# Patient Record
Sex: Female | Born: 1975
Health system: Southern US, Community
[De-identification: ages and names within clinical notes are randomized; demographics above are authoritative.]

## PROBLEM LIST (undated history)

## (undated) DIAGNOSIS — I10 Essential (primary) hypertension: Secondary | ICD-10-CM

## (undated) DIAGNOSIS — D649 Anemia, unspecified: Secondary | ICD-10-CM

## (undated) DIAGNOSIS — T8859XA Other complications of anesthesia, initial encounter: Secondary | ICD-10-CM

## (undated) DIAGNOSIS — T7840XA Allergy, unspecified, initial encounter: Secondary | ICD-10-CM

## (undated) HISTORY — DX: Allergy, unspecified, initial encounter: T78.40XA

## (undated) HISTORY — DX: Anemia, unspecified: D64.9

---

## 1998-08-19 ENCOUNTER — Other Ambulatory Visit: Admission: RE | Admit: 1998-08-19 | Discharge: 1998-08-19 | Payer: Self-pay | Admitting: Obstetrics and Gynecology

## 1998-09-24 ENCOUNTER — Encounter: Payer: Self-pay | Admitting: Obstetrics and Gynecology

## 1998-09-24 ENCOUNTER — Ambulatory Visit (HOSPITAL_COMMUNITY): Admission: RE | Admit: 1998-09-24 | Discharge: 1998-09-24 | Payer: Self-pay | Admitting: Obstetrics and Gynecology

## 1998-09-30 ENCOUNTER — Encounter: Payer: Self-pay | Admitting: Emergency Medicine

## 1998-09-30 ENCOUNTER — Emergency Department (HOSPITAL_COMMUNITY): Admission: EM | Admit: 1998-09-30 | Discharge: 1998-09-30 | Payer: Self-pay | Admitting: Emergency Medicine

## 1998-10-06 ENCOUNTER — Inpatient Hospital Stay (HOSPITAL_COMMUNITY): Admission: AD | Admit: 1998-10-06 | Discharge: 1998-10-06 | Payer: Self-pay | Admitting: Obstetrics and Gynecology

## 1998-10-23 ENCOUNTER — Ambulatory Visit (HOSPITAL_COMMUNITY): Admission: RE | Admit: 1998-10-23 | Discharge: 1998-10-23 | Payer: Self-pay | Admitting: Obstetrics & Gynecology

## 1998-11-27 ENCOUNTER — Encounter: Payer: Self-pay | Admitting: Obstetrics and Gynecology

## 1998-11-27 ENCOUNTER — Ambulatory Visit (HOSPITAL_COMMUNITY): Admission: RE | Admit: 1998-11-27 | Discharge: 1998-11-27 | Payer: Self-pay | Admitting: Obstetrics and Gynecology

## 1998-12-03 ENCOUNTER — Inpatient Hospital Stay (HOSPITAL_COMMUNITY): Admission: AD | Admit: 1998-12-03 | Discharge: 1998-12-03 | Payer: Self-pay | Admitting: Obstetrics and Gynecology

## 1998-12-03 ENCOUNTER — Encounter (HOSPITAL_COMMUNITY): Admission: RE | Admit: 1998-12-03 | Discharge: 1999-02-22 | Payer: Self-pay | Admitting: Obstetrics and Gynecology

## 1998-12-04 ENCOUNTER — Observation Stay (HOSPITAL_COMMUNITY): Admission: AD | Admit: 1998-12-04 | Discharge: 1998-12-04 | Payer: Self-pay | Admitting: Obstetrics and Gynecology

## 1998-12-10 ENCOUNTER — Inpatient Hospital Stay (HOSPITAL_COMMUNITY): Admission: AD | Admit: 1998-12-10 | Discharge: 1998-12-10 | Payer: Self-pay | Admitting: Obstetrics & Gynecology

## 1998-12-14 ENCOUNTER — Encounter: Payer: Self-pay | Admitting: Obstetrics and Gynecology

## 1998-12-22 ENCOUNTER — Inpatient Hospital Stay (HOSPITAL_COMMUNITY): Admission: AD | Admit: 1998-12-22 | Discharge: 1998-12-22 | Payer: Self-pay | Admitting: Obstetrics and Gynecology

## 1998-12-29 ENCOUNTER — Observation Stay (HOSPITAL_COMMUNITY): Admission: AD | Admit: 1998-12-29 | Discharge: 1998-12-30 | Payer: Self-pay | Admitting: Obstetrics and Gynecology

## 1998-12-30 ENCOUNTER — Encounter: Payer: Self-pay | Admitting: Obstetrics and Gynecology

## 1999-01-26 ENCOUNTER — Inpatient Hospital Stay (HOSPITAL_COMMUNITY): Admission: AD | Admit: 1999-01-26 | Discharge: 1999-01-26 | Payer: Self-pay | Admitting: Obstetrics and Gynecology

## 1999-01-31 ENCOUNTER — Inpatient Hospital Stay (HOSPITAL_COMMUNITY): Admission: AD | Admit: 1999-01-31 | Discharge: 1999-01-31 | Payer: Self-pay | Admitting: Obstetrics and Gynecology

## 1999-02-07 ENCOUNTER — Inpatient Hospital Stay (HOSPITAL_COMMUNITY): Admission: AD | Admit: 1999-02-07 | Discharge: 1999-02-07 | Payer: Self-pay | Admitting: Obstetrics & Gynecology

## 1999-02-08 ENCOUNTER — Observation Stay (HOSPITAL_COMMUNITY): Admission: AD | Admit: 1999-02-08 | Discharge: 1999-02-09 | Payer: Self-pay | Admitting: Obstetrics & Gynecology

## 1999-02-14 ENCOUNTER — Inpatient Hospital Stay (HOSPITAL_COMMUNITY): Admission: AD | Admit: 1999-02-14 | Discharge: 1999-02-14 | Payer: Self-pay | Admitting: *Deleted

## 1999-02-15 ENCOUNTER — Inpatient Hospital Stay (HOSPITAL_COMMUNITY): Admission: AD | Admit: 1999-02-15 | Discharge: 1999-02-15 | Payer: Self-pay | Admitting: Obstetrics and Gynecology

## 1999-02-19 ENCOUNTER — Inpatient Hospital Stay (HOSPITAL_COMMUNITY): Admission: AD | Admit: 1999-02-19 | Discharge: 1999-02-23 | Payer: Self-pay | Admitting: Obstetrics and Gynecology

## 2000-01-18 ENCOUNTER — Emergency Department (HOSPITAL_COMMUNITY): Admission: EM | Admit: 2000-01-18 | Discharge: 2000-01-18 | Payer: Self-pay | Admitting: *Deleted

## 2000-01-31 ENCOUNTER — Emergency Department (HOSPITAL_COMMUNITY): Admission: EM | Admit: 2000-01-31 | Discharge: 2000-01-31 | Payer: Self-pay | Admitting: *Deleted

## 2000-02-02 ENCOUNTER — Emergency Department (HOSPITAL_COMMUNITY): Admission: EM | Admit: 2000-02-02 | Discharge: 2000-02-02 | Payer: Self-pay | Admitting: *Deleted

## 2000-02-06 ENCOUNTER — Inpatient Hospital Stay (HOSPITAL_COMMUNITY): Admission: AD | Admit: 2000-02-06 | Discharge: 2000-02-06 | Payer: Self-pay | Admitting: Obstetrics & Gynecology

## 2000-08-16 ENCOUNTER — Encounter: Payer: Self-pay | Admitting: Emergency Medicine

## 2000-08-16 ENCOUNTER — Emergency Department (HOSPITAL_COMMUNITY): Admission: EM | Admit: 2000-08-16 | Discharge: 2000-08-16 | Payer: Self-pay | Admitting: Emergency Medicine

## 2000-08-28 ENCOUNTER — Inpatient Hospital Stay (HOSPITAL_COMMUNITY): Admission: AD | Admit: 2000-08-28 | Discharge: 2000-08-28 | Payer: Self-pay | Admitting: Obstetrics

## 2000-08-28 ENCOUNTER — Encounter: Payer: Self-pay | Admitting: Obstetrics

## 2000-09-25 ENCOUNTER — Emergency Department (HOSPITAL_COMMUNITY): Admission: EM | Admit: 2000-09-25 | Discharge: 2000-09-25 | Payer: Self-pay | Admitting: Emergency Medicine

## 2000-10-09 ENCOUNTER — Encounter: Payer: Self-pay | Admitting: *Deleted

## 2000-10-09 ENCOUNTER — Emergency Department (HOSPITAL_COMMUNITY): Admission: EM | Admit: 2000-10-09 | Discharge: 2000-10-09 | Payer: Self-pay | Admitting: *Deleted

## 2000-12-26 ENCOUNTER — Emergency Department (HOSPITAL_COMMUNITY): Admission: EM | Admit: 2000-12-26 | Discharge: 2000-12-26 | Payer: Self-pay | Admitting: Emergency Medicine

## 2001-06-02 ENCOUNTER — Encounter: Payer: Self-pay | Admitting: Emergency Medicine

## 2001-06-02 ENCOUNTER — Emergency Department (HOSPITAL_COMMUNITY): Admission: EM | Admit: 2001-06-02 | Discharge: 2001-06-02 | Payer: Self-pay | Admitting: Emergency Medicine

## 2002-01-30 ENCOUNTER — Emergency Department (HOSPITAL_COMMUNITY): Admission: EM | Admit: 2002-01-30 | Discharge: 2002-01-30 | Payer: Self-pay | Admitting: Emergency Medicine

## 2002-11-05 ENCOUNTER — Encounter: Payer: Self-pay | Admitting: *Deleted

## 2002-11-05 ENCOUNTER — Inpatient Hospital Stay (HOSPITAL_COMMUNITY): Admission: AD | Admit: 2002-11-05 | Discharge: 2002-11-05 | Payer: Self-pay | Admitting: *Deleted

## 2003-03-14 ENCOUNTER — Inpatient Hospital Stay (HOSPITAL_COMMUNITY): Admission: AD | Admit: 2003-03-14 | Discharge: 2003-03-14 | Payer: Self-pay | Admitting: Family Medicine

## 2003-04-14 ENCOUNTER — Inpatient Hospital Stay (HOSPITAL_COMMUNITY): Admission: AD | Admit: 2003-04-14 | Discharge: 2003-04-14 | Payer: Self-pay | Admitting: Obstetrics and Gynecology

## 2005-10-17 ENCOUNTER — Emergency Department (HOSPITAL_COMMUNITY): Admission: EM | Admit: 2005-10-17 | Discharge: 2005-10-17 | Payer: Self-pay | Admitting: Emergency Medicine

## 2005-12-14 ENCOUNTER — Inpatient Hospital Stay (HOSPITAL_COMMUNITY): Admission: AD | Admit: 2005-12-14 | Discharge: 2005-12-15 | Payer: Self-pay | Admitting: Gynecology

## 2006-06-22 ENCOUNTER — Emergency Department (HOSPITAL_COMMUNITY): Admission: EM | Admit: 2006-06-22 | Discharge: 2006-06-22 | Payer: Self-pay | Admitting: Emergency Medicine

## 2006-10-10 ENCOUNTER — Emergency Department (HOSPITAL_COMMUNITY): Admission: EM | Admit: 2006-10-10 | Discharge: 2006-10-10 | Payer: Self-pay | Admitting: Family Medicine

## 2007-12-26 ENCOUNTER — Ambulatory Visit: Payer: Self-pay | Admitting: Pulmonary Disease

## 2007-12-26 DIAGNOSIS — J309 Allergic rhinitis, unspecified: Secondary | ICD-10-CM | POA: Insufficient documentation

## 2007-12-26 DIAGNOSIS — J45909 Unspecified asthma, uncomplicated: Secondary | ICD-10-CM

## 2008-03-15 ENCOUNTER — Inpatient Hospital Stay (HOSPITAL_COMMUNITY): Admission: AD | Admit: 2008-03-15 | Discharge: 2008-03-15 | Payer: Self-pay | Admitting: Obstetrics & Gynecology

## 2008-04-17 ENCOUNTER — Emergency Department (HOSPITAL_COMMUNITY): Admission: EM | Admit: 2008-04-17 | Discharge: 2008-04-17 | Payer: Self-pay | Admitting: Emergency Medicine

## 2009-01-06 ENCOUNTER — Emergency Department (HOSPITAL_COMMUNITY): Admission: EM | Admit: 2009-01-06 | Discharge: 2009-01-06 | Payer: Self-pay | Admitting: Emergency Medicine

## 2009-07-14 ENCOUNTER — Emergency Department (HOSPITAL_COMMUNITY): Admission: EM | Admit: 2009-07-14 | Discharge: 2009-07-14 | Payer: Self-pay | Admitting: Family Medicine

## 2010-01-14 ENCOUNTER — Emergency Department (HOSPITAL_COMMUNITY): Admission: EM | Admit: 2010-01-14 | Discharge: 2010-01-14 | Payer: Self-pay | Admitting: Emergency Medicine

## 2010-03-10 ENCOUNTER — Emergency Department (HOSPITAL_COMMUNITY): Admission: EM | Admit: 2010-03-10 | Discharge: 2010-03-10 | Payer: Self-pay | Admitting: Family Medicine

## 2010-09-01 ENCOUNTER — Emergency Department (HOSPITAL_COMMUNITY)
Admission: EM | Admit: 2010-09-01 | Discharge: 2010-09-01 | Payer: Self-pay | Source: Home / Self Care | Admitting: Family Medicine

## 2010-11-17 ENCOUNTER — Inpatient Hospital Stay (INDEPENDENT_AMBULATORY_CARE_PROVIDER_SITE_OTHER)
Admission: RE | Admit: 2010-11-17 | Discharge: 2010-11-17 | Disposition: A | Discharge status: Home / Self Care | Payer: Medicaid Other | Source: Ambulatory Visit | Attending: Emergency Medicine | Admitting: Emergency Medicine

## 2010-11-17 DIAGNOSIS — J019 Acute sinusitis, unspecified: Secondary | ICD-10-CM

## 2010-12-21 ENCOUNTER — Inpatient Hospital Stay (INDEPENDENT_AMBULATORY_CARE_PROVIDER_SITE_OTHER)
Admission: RE | Admit: 2010-12-21 | Discharge: 2010-12-21 | Disposition: A | Discharge status: Home / Self Care | Payer: Medicaid Other | Source: Ambulatory Visit | Attending: Family Medicine | Admitting: Family Medicine

## 2010-12-21 DIAGNOSIS — R6889 Other general symptoms and signs: Secondary | ICD-10-CM

## 2010-12-21 LAB — POCT URINALYSIS DIP (DEVICE)
Nitrite: NEGATIVE
Specific Gravity, Urine: 1.015 (ref 1.005–1.030)
Urobilinogen, UA: 1 mg/dL (ref 0.0–1.0)

## 2010-12-21 LAB — POCT I-STAT, CHEM 8
BUN: 16 mg/dL (ref 6–23)
Chloride: 102 mEq/L (ref 96–112)
Potassium: 4.1 mEq/L (ref 3.5–5.1)
Sodium: 139 mEq/L (ref 135–145)
TCO2: 27 mmol/L (ref 0–100)

## 2010-12-21 LAB — GLUCOSE, CAPILLARY: Glucose-Capillary: 109 mg/dL — ABNORMAL HIGH (ref 70–99)

## 2011-06-30 LAB — URINALYSIS, ROUTINE W REFLEX MICROSCOPIC
Glucose, UA: NEGATIVE
Ketones, ur: NEGATIVE
Specific Gravity, Urine: 1.015
pH: 7

## 2011-06-30 LAB — URINE CULTURE: Colony Count: >=100000

## 2011-06-30 LAB — POCT PREGNANCY, URINE: Preg Test, Ur: POSITIVE

## 2011-12-13 ENCOUNTER — Emergency Department (HOSPITAL_COMMUNITY)
Admission: EM | Admit: 2011-12-13 | Discharge: 2011-12-13 | Disposition: A | Discharge status: Home / Self Care | Payer: Medicaid Other | Source: Home / Self Care | Attending: Emergency Medicine | Admitting: Emergency Medicine

## 2011-12-13 ENCOUNTER — Encounter (HOSPITAL_COMMUNITY): Payer: Self-pay | Admitting: Emergency Medicine

## 2011-12-13 DIAGNOSIS — R609 Edema, unspecified: Secondary | ICD-10-CM

## 2011-12-13 HISTORY — DX: Essential (primary) hypertension: I10

## 2011-12-13 MED ORDER — FUROSEMIDE 20 MG PO TABS: 20.0000 mg | ORAL_TABLET | Freq: Two times a day (BID) | ORAL | Status: AC

## 2011-12-13 MED ORDER — POTASSIUM CHLORIDE ER 10 MEQ PO TBCR: 20.0000 meq | EXTENDED_RELEASE_TABLET | Freq: Once | ORAL | Status: AC

## 2011-12-13 NOTE — ED Notes (Addendum)
Patient goes to [facility]

## 2011-12-13 NOTE — ED Notes (Addendum)
Ankle swelling for two months.  Patient reports no change in swelling with elevation or after sleeping all night.  Pain intermittent lower legs, ankle, feet -irritating and "like skin can't stretch anymore".  Denies chest pain.  Denies sob.  Patient did see a physician recently, she prescribed blood pressure medicine. Has been on bp medicine for a month.  Reports no change in swelling with medicine.  Patient is concerned for diabetes

## 2011-12-13 NOTE — Discharge Instructions (Signed)
Discussed followup with your primary care Dr. for a more specific plan of weight reduction and lifestyle modifications as I suspect your peripheral edema is related to overweight and current lifestyle. Take his medicines as prescribed for the next 5 days.     Peripheral Edema You have swelling in your legs (peripheral edema). This swelling is due to excess accumulation of salt and water in your body. Edema may be a sign of heart, kidney or liver disease, or a side effect of a medication. It may also be due to problems in the leg veins. Elevating your legs and using special support stockings may be very helpful, if the cause of the swelling is due to poor venous circulation. Avoid long periods of standing, whatever the cause. Treatment of edema depends on identifying the cause. Chips, pretzels, pickles and other salty foods should be avoided. Restricting salt in your diet is almost always needed. Water pills (diuretics) are often used to remove the excess salt and water from your body via urine. These medicines prevent the kidney from reabsorbing sodium. This increases urine flow. Diuretic treatment may also result in lowering of potassium levels in your body. Potassium supplements may be needed if you have to use diuretics daily. Daily weights can help you keep track of your progress in clearing your edema. You should call your caregiver for follow up care as recommended. SEEK IMMEDIATE MEDICAL CARE IF:   You have increased swelling, pain, redness, or heat in your legs.   You develop shortness of breath, especially when lying down.   You develop chest or abdominal pain, weakness, or fainting.   You have a fever.  Document Released: 10/27/2004 Document Revised: 09/08/2011 Document Reviewed: 10/07/2009 [facility] Patient Information 2012 [place], [place].

## 2011-12-13 NOTE — ED Provider Notes (Signed)
History     CSN: [id number]  Arrival date & time 12/13/11  1112   First MD Initiated Contact with Patient 12/13/11 1221      Chief Complaint  Patient presents with  . Joint Swelling    (Consider location/radiation/quality/duration/timing/severity/associated sxs/prior treatment) HPI Comments: The swelling of my legs, its not getting any better been taking this small pill for my blood pressure nd decrease my swelling but is not getting any better"  NO SOB, NO INJURIES OR TRAUMA,  FEEL  The swelling its worse at the end of the day,I also want to be checked to see if im diabetic as I have a lot of relatives with diabetes an i drink  Lot of fluids  The history is provided by the patient.    Past Medical History  Diagnosis Date  . Hypertension   . Asthma     Past Surgical History  Procedure Date  . Cesarean section     Family History  Problem Relation Age of Onset  . Diabetes Mother   . Multiple sclerosis Mother   . Hypertension Father   . Diabetes Father     History  Substance Use Topics  . Smoking status: Never Smoker   . Smokeless tobacco: Not on file  . Alcohol Use: Yes    OB History    Grav Para Term Preterm Abortions TAB SAB Ect Mult Living                  Review of Systems  Constitutional: Negative for fever and activity change.  Respiratory: Negative for chest tightness and shortness of breath.   Cardiovascular: Positive for leg swelling. Negative for chest pain and palpitations.  Gastrointestinal: Negative for abdominal pain.  Genitourinary: Positive for frequency. Negative for dysuria and urgency.  Musculoskeletal: Negative for myalgias and joint swelling.  Skin: Negative for rash.    Allergies  Oxycodone  Home Medications   Current Outpatient Rx  Name Route Sig Dispense Refill  . FUROSEMIDE 20 MG PO TABS Oral Take 1 tablet (20 mg total) by mouth 2 (two) times daily. 5 tablet 0  . POTASSIUM CHLORIDE ER 10 MEQ PO TBCR Oral Take 2 tablets  (20 mEq total) by mouth once. 5 tablet 0    BP 117/73  Pulse 78  Temp(Src) 97.3 F (36.3 C) (Oral)  Resp 16  SpO2 99%  Physical Exam  Nursing note and vitals reviewed. Constitutional: She appears well-nourished. No distress.  HENT:  Head: Normocephalic.  Eyes: Conjunctivae are normal. No scleral icterus.  Neck: No JVD present.  Cardiovascular: Normal rate.   Pulmonary/Chest: No respiratory distress. She has no wheezes. She has no rales. She exhibits no tenderness.  Musculoskeletal: She exhibits edema.       Legs: Skin: Skin is intact. No abrasion and no rash noted.    ED Course  Procedures (including critical care time)   Labs Reviewed  GLUCOSE, CAPILLARY   No results found.   1. Peripheral edema       MDM  RECURRENT peripheral edema, no cardiovascular symptoms undergoing treatment with diuretics by PCP.        Jimmie Molly, MD 12/13/11 2039

## 2012-12-14 ENCOUNTER — Emergency Department (HOSPITAL_COMMUNITY)
Admission: EM | Admit: 2012-12-14 | Discharge: 2012-12-14 | Disposition: A | Discharge status: Home / Self Care | Payer: BC Managed Care – PPO | Source: Home / Self Care | Attending: Emergency Medicine | Admitting: Emergency Medicine

## 2012-12-14 ENCOUNTER — Encounter (HOSPITAL_COMMUNITY): Payer: Self-pay | Admitting: Emergency Medicine

## 2012-12-14 DIAGNOSIS — J069 Acute upper respiratory infection, unspecified: Secondary | ICD-10-CM

## 2012-12-14 NOTE — ED Notes (Signed)
Repositioned exam table, offered blankets and pillow

## 2012-12-14 NOTE — ED Notes (Signed)
Provided copies of work note for multiple jobs

## 2012-12-14 NOTE — ED Provider Notes (Signed)
Medical screening examination/treatment/procedure(s) were performed by non-physician practitioner and as supervising physician I was immediately available for consultation/collaboration.  Leslee Home, M.D.  Reuben Likes, MD 12/14/12 [phone]

## 2012-12-14 NOTE — ED Provider Notes (Signed)
History     CSN: [id number]  Arrival date & time 12/14/12  1236   First MD Initiated Contact with Patient 12/14/12 1400      Chief Complaint  Patient presents with  . URI    (Consider location/radiation/quality/duration/timing/severity/associated sxs/prior treatment) Patient is a 37 y.o. female presenting with URI. The history is provided by the patient.  URI Presenting symptoms: congestion, cough, fever, rhinorrhea and sore throat   Presenting symptoms: no ear pain   Severity:  Moderate Onset quality:  Gradual Duration:  4 days Timing:  Constant Progression:  Worsening Chronicity:  New Relieved by:  OTC medications Worsened by:  Certain positions Ineffective treatments:  OTC medications (otc cold medicine helps but only temporarily) Associated symptoms: no sinus pain     Past Medical History  Diagnosis Date  . Hypertension   . Asthma     Past Surgical History  Procedure Laterality Date  . Cesarean section      Family History  Problem Relation Age of Onset  . Diabetes Mother   . Multiple sclerosis Mother   . Hypertension Father   . Diabetes Father     History  Substance Use Topics  . Smoking status: Never Smoker   . Smokeless tobacco: Not on file  . Alcohol Use: Yes    OB History   Grav Para Term Preterm Abortions TAB SAB Ect Mult Living                  Review of Systems  Constitutional: Positive for fever and chills.  HENT: Positive for congestion, sore throat, rhinorrhea and sinus pressure. Negative for ear pain.   Respiratory: Positive for cough. Negative for shortness of breath.     Allergies  Oxycodone  Home Medications   Current Outpatient Rx  Name  Route  Sig  Dispense  Refill  . OVER THE COUNTER MEDICATION      Over the counter multi-symptom medicine           BP 119/81  Pulse 76  Temp(Src) 97.5 F (36.4 C) (Oral)  Resp 14  SpO2 98%  Physical Exam  Constitutional: She appears well-developed and well-nourished. No  distress.  HENT:  Right Ear: External ear normal.  Left Ear: External ear normal.  Nose: Mucosal edema and rhinorrhea present. Right sinus exhibits no maxillary sinus tenderness and no frontal sinus tenderness. Left sinus exhibits no maxillary sinus tenderness and no frontal sinus tenderness.  Mouth/Throat: Oropharynx is clear and moist and mucous membranes are normal.  B ear canals with cerumen  Cardiovascular: Normal rate and regular rhythm.   Pulmonary/Chest: Effort normal and breath sounds normal.  No coughing observed during h&p  Lymphadenopathy:       Head (right side): No submental, no submandibular and no tonsillar adenopathy present.       Head (left side): No submental, no submandibular and no tonsillar adenopathy present.    ED Course  Procedures (including critical care time)  Labs Reviewed - No data to display No results found.   1. URI (upper respiratory infection)       MDM  Sx most c/w uri.  Discussed home mgmt of sx.         Cathlyn Parsons, NP 12/14/12 1406

## 2012-12-14 NOTE — ED Notes (Signed)
Aching, cold chills, reports slight temp a few days ago-99.5.  Head congestion, stuffiness, minimal sore throat, and developing a cough

## 2013-09-03 ENCOUNTER — Emergency Department (HOSPITAL_COMMUNITY)
Admission: EM | Admit: 2013-09-03 | Discharge: 2013-09-03 | Disposition: A | Discharge status: Home / Self Care | Payer: BC Managed Care – PPO | Source: Home / Self Care | Attending: Family Medicine | Admitting: Family Medicine

## 2013-09-03 ENCOUNTER — Encounter (HOSPITAL_COMMUNITY): Payer: Self-pay | Admitting: Emergency Medicine

## 2013-09-03 DIAGNOSIS — J40 Bronchitis, not specified as acute or chronic: Secondary | ICD-10-CM

## 2013-09-03 MED ORDER — ALBUTEROL SULFATE (5 MG/ML) 0.5% IN NEBU
5.0000 mg | INHALATION_SOLUTION | Freq: Once | RESPIRATORY_TRACT | Status: AC
  Administered 2013-09-03: 5 mg via RESPIRATORY_TRACT

## 2013-09-03 MED ORDER — IPRATROPIUM BROMIDE 0.02 % IN SOLN
RESPIRATORY_TRACT | Status: AC
  Filled 2013-09-03: qty 2.5

## 2013-09-03 MED ORDER — PREDNISONE 50 MG PO TABS: 50.0000 mg | ORAL_TABLET | Freq: Every day | ORAL | Status: DC

## 2013-09-03 MED ORDER — IPRATROPIUM BROMIDE 0.02 % IN SOLN
0.5000 mg | Freq: Once | RESPIRATORY_TRACT | Status: AC
  Administered 2013-09-03: 0.5 mg via RESPIRATORY_TRACT

## 2013-09-03 MED ORDER — ALBUTEROL SULFATE (5 MG/ML) 0.5% IN NEBU
INHALATION_SOLUTION | RESPIRATORY_TRACT | Status: AC
  Filled 2013-09-03: qty 1

## 2013-09-03 MED ORDER — GUAIFENESIN-CODEINE 100-10 MG/5ML PO SOLN: 5.0000 mL | Freq: Every evening | ORAL | Status: DC | PRN

## 2013-09-03 MED ORDER — IPRATROPIUM BROMIDE 0.06 % NA SOLN: 2.0000 | Freq: Four times a day (QID) | NASAL | Status: DC

## 2013-09-03 MED ORDER — ALBUTEROL SULFATE HFA 108 (90 BASE) MCG/ACT IN AERS: 2.0000 | INHALATION_SPRAY | Freq: Four times a day (QID) | RESPIRATORY_TRACT | Status: DC | PRN

## 2013-09-03 NOTE — ED Notes (Signed)
Pt  Reports    Symptoms     Of      Sinus  Congestion   With  Cough      sorethroat  And  Hoarseness   As  Well    Pt   Has  A  History of  Asthma  And  Has  Some  Tightness  In  Chest  As  Well  As  Other  Symptoms      She  Is  Sitting  Upright on  The  Exam table  She  Is  Speaking in  Complete  sentances  And  Is  In no acute  Distress

## 2013-09-03 NOTE — ED Provider Notes (Signed)
Kelly Meyers is a 37 y.o. female who presents to Urgent Care today for 5 days of congestion, productive cough or sore throat and shortness of breath. Patient also notes some mild wheezing. She's tried a nebulizer treatment at home which was only temporarily effective. She has a history of asthma but that is currently well controlled. No nausea vomiting or diarrhea. 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   Past Medical History  Diagnosis Date  . Hypertension   . Asthma    History  Substance Use Topics  . Smoking status: Never Smoker   . Smokeless tobacco: Not on file  . Alcohol Use: Yes   ROS as above Medications reviewed. No current facility-administered medications for this encounter.   Current Outpatient Prescriptions  Medication Sig Dispense Refill  . albuterol (PROVENTIL HFA;VENTOLIN HFA) 108 (90 BASE) MCG/ACT inhaler Inhale 2 puffs into the lungs every 6 (six) hours as needed for wheezing or shortness of breath.  1 Inhaler  2  . guaiFENesin-codeine 100-10 MG/5ML syrup Take 5 mLs by mouth at bedtime as needed for cough.  120 mL  0  . ipratropium (ATROVENT) 0.06 % nasal spray Place 2 sprays into both nostrils 4 (four) times daily.  15 mL  1  . OVER THE COUNTER MEDICATION Over the counter multi-symptom medicine      . predniSONE (DELTASONE) 50 MG tablet Take 1 tablet (50 mg total) by mouth daily.  5 tablet  0    Exam:  BP 145/88  Pulse 84  Temp(Src) 98.3 F (36.8 C) (Oral)  Resp 16  SpO2 100% Gen: Well NAD HEENT: EOMI,  MMM, ears are occluded by cerumen bilaterally. Posterior pharynx is mildly erythematous.  tympanic membranes are normal following cerumen removal Lungs: Normal work of breathing. CTABL prolonged expiratory phase bilaterally Heart: RRR no MRG Abd: NABS, Soft. NT, ND Exts: Non edematous BL  LE, warm and well perfused.   Patient was given a DuoNeb nebulizer treatment and had considerable improvement in  symptoms. Additionally patient's ear canals were irrigated. She had improvement in ear symptoms.  No results found for this or any previous visit (from the past 24 hour(s)). No results found.  Assessment and Plan: 37 y.o. female with viral bronchitis. Plan to treat with prednisone, albuterol, Atrovent nasal spray, codeine containing cough medication. Followup with primary care provider. Discussed warning signs or symptoms. Please see discharge instructions. Patient expresses understanding.      Rodolph Bong, MD 09/03/13 [phone]

## 2013-09-03 NOTE — ED Notes (Signed)
Pt  Is  Breathing  Much  Better

## 2013-09-15 ENCOUNTER — Encounter (HOSPITAL_COMMUNITY): Payer: Self-pay | Admitting: Emergency Medicine

## 2013-09-15 ENCOUNTER — Emergency Department (HOSPITAL_COMMUNITY)
Admission: EM | Admit: 2013-09-15 | Discharge: 2013-09-15 | Disposition: A | Discharge status: Home / Self Care | Payer: BC Managed Care – PPO | Attending: Emergency Medicine | Admitting: Emergency Medicine

## 2013-09-15 DIAGNOSIS — R21 Rash and other nonspecific skin eruption: Secondary | ICD-10-CM | POA: Insufficient documentation

## 2013-09-15 DIAGNOSIS — Z79899 Other long term (current) drug therapy: Secondary | ICD-10-CM | POA: Insufficient documentation

## 2013-09-15 DIAGNOSIS — I10 Essential (primary) hypertension: Secondary | ICD-10-CM | POA: Insufficient documentation

## 2013-09-15 DIAGNOSIS — A4902 Methicillin resistant Staphylococcus aureus infection, unspecified site: Secondary | ICD-10-CM | POA: Insufficient documentation

## 2013-09-15 DIAGNOSIS — J45909 Unspecified asthma, uncomplicated: Secondary | ICD-10-CM | POA: Insufficient documentation

## 2013-09-15 MED ORDER — CLINDAMYCIN HCL 150 MG PO CAPS: 450.0000 mg | ORAL_CAPSULE | Freq: Three times a day (TID) | ORAL | Status: DC

## 2013-09-15 NOTE — ED Provider Notes (Signed)
CSN: [id number]     Arrival date & time 09/15/13  1629 History   This chart was scribed for non-physician practitioner Raymon Mutton, PA-C working with Richardean Canal, MD by Caryn Bee, ED Scribe. This patient was seen in room TR09C/TR09C and the patient's care was started at 5:29 PM.    Chief Complaint  Patient presents with  . Rash   HPI HPI Comments: Kelly Meyers is a 37 y.o. female who presents to the Emergency Department complaining of gradual onset painful red rash that began initially on her stomach 2 days ago. Pt reports that the rash has since spread to her bilateral arms and back. She states that the rash looks like a little pimple when they initially develop, but eventually turn into scabs. Reports pain when areas are touched. Denies drainage, bleeding, pus. Pt denies being bitten by anything, changes in lotion, soap, detergents, diet. Pt states that she had URI about 2 weeks ago. She saw her PCP and was given albuterol, prednisone, hydrocodone, nasal spray. Pt has h/o asthma. She states that she has h/o abscess, but not similar rash. 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 Denies fever, chills, chest pain, SOB, difficulty breathing, weakness, numbness, tingling, decreased appetite, sore throat, difficulty swallowing.    Past Medical History  Diagnosis Date  . Hypertension   . Asthma    Past Surgical History  Procedure Laterality Date  . Cesarean section     Family History  Problem Relation Age of Onset  . Diabetes Mother   . Multiple sclerosis Mother   . Hypertension Father   . Diabetes Father    History  Substance Use Topics  . Smoking status: Never Smoker   . Smokeless tobacco: Not on file  . Alcohol Use: Yes   OB History   Grav Para Term Preterm Abortions TAB SAB Ect Mult Living                 Review of Systems  Constitutional: Negative for fever and appetite change.  HENT: Negative for sore throat and trouble swallowing.   Respiratory: Negative for  shortness of breath.   Cardiovascular: Negative for chest pain.  Skin: Positive for rash.  Neurological: Negative for weakness and numbness.  All other systems reviewed and are negative.    Allergies  Shellfish allergy; Onion; and Oxycodone  Home Medications   Current Outpatient Rx  Name  Route  Sig  Dispense  Refill  . albuterol (PROVENTIL HFA;VENTOLIN HFA) 108 (90 BASE) MCG/ACT inhaler   Inhalation   Inhale 1-2 puffs into the lungs every 6 (six) hours as needed for wheezing or shortness of breath.         . Fluticasone-Salmeterol (ADVAIR DISKUS) 250-50 MCG/DOSE AEPB   Inhalation   Inhale 1 puff into the lungs 2 (two) times daily.         Marland Kitchen ibuprofen (ADVIL,MOTRIN) 200 MG tablet   Oral   Take 200 mg by mouth daily as needed for mild pain.         . clindamycin (CLEOCIN) 150 MG capsule   Oral   Take 3 capsules (450 mg total) by mouth 3 (three) times daily.   90 capsule   0    BP 149/92  Pulse 110  Temp(Src) 98.2 F (36.8 C) (Oral)  Resp 18  Wt 236 lb 9.6 oz (107.321 kg)  SpO2 99%  Physical Exam  Nursing note and vitals reviewed. Constitutional: She is oriented to person, place,  and time. She appears well-developed and well-nourished. No distress.  HENT:  Head: Normocephalic and atraumatic.  Mouth/Throat: Oropharynx is clear and moist. No oropharyngeal exudate.  Negative oral lesions identified Negative facial swelling Negative angioedema  Eyes: Conjunctivae and EOM are normal. Pupils are equal, round, and reactive to light. Right eye exhibits no discharge. Left eye exhibits no discharge.  Neck: Normal range of motion. Neck supple. No tracheal deviation present.  Cardiovascular: Normal rate, regular rhythm and normal heart sounds.  Exam reveals no friction rub.   No murmur heard. Pulses:      Radial pulses are 2+ on the right side, and 2+ on the left side.  Pulmonary/Chest: Effort normal and breath sounds normal. No respiratory distress. She has no  wheezes. She has no rales.  Airway intact Patient able to speak in full sentences without difficulty Negative respiratory distress Negative use of accessory muscles  Musculoskeletal: Normal range of motion.  Neurological: She is alert and oriented to person, place, and time.  Skin: Skin is warm and dry. Rash noted. She is not diaphoretic.  Pustules with surrounding erythema, halos, noted to the abdomen and flexor and extensor surfaces of the arms bilaterally. Most pronounced and defined on the abdomen. Some lesions on the abdomen are scabbed over. Negative active drainage. Negative cellulitic infection identified. Discomfort upon palpation.   Psychiatric: She has a normal mood and affect. Her behavior is normal.    ED Course  Procedures (including critical care time) DIAGNOSTIC STUDIES: Oxygen Saturation is 99% on room air, normal by my interpretation.    COORDINATION OF CARE: 5:33 PM-Discussed treatment plan with pt at bedside and pt agreed to plan.   5:43 PM This provider discussed history, case, presentation with attending physician-Dr. D. Silverio Lay. Dr. Cheri Rous saw and assessed patient - as per physician agreed that this is a staph infection-MRSA. As per physician recommended that patient be started on clindamycin.  Labs Review Labs Reviewed - No data to display Imaging Review No results found.  EKG Interpretation   None       MDM   1. MRSA infection   2. Rash     Filed Vitals:   09/15/13 1635 09/15/13 1750  BP: 149/92   Pulse: 110 96  Temp: 98.2 F (36.8 C)   TempSrc: Oral   Resp: 18   Weight: 236 lb 9.6 oz (107.321 kg)   SpO2: 99%      I personally performed the services described in this documentation, which was scribed in my presence. The recorded information has been reviewed and is accurate.  Patient presenting to emergency department with rash that has been ongoing for the past 2 weeks. Patient reports that she was experiencing upper respiratory infection,  cold-like symptoms that started approximately 2 weeks ago. Patient reports she was placed on prednisone, cough syrup, albuterol inhaler for the upper respiratory infection-reported that prior to starting the medication she noticed some pustules forming on her body. Reported that the rash began on her abdomen but, has now spread to her arms - reported she has one lesion on the back of her left leg. Patient reports that they are not itchy but states they're tender upon palpation. Patient is a CNA at a nursing home. Alert and oriented. GCS 15. Heart rate and rhythm normal. Pulses palpable and strong, radial 2+ bilaterally. Lungs clear to auscultation to upper and lower lobes bilaterally. Pustules with surrounding erythema identified on the abdomen-some have scabbed over. Pustules beginning to form on the  arms bilaterally at both flexor and extensor surfaces - circumferentially. Negative active drainage or bleeding identified. Negative findings for cellulitic infection. Negative angioedema. Negative facial swelling identified. Negative respiratory distress-tongue swelling. Doubt allergic reaction. Doubt scabies. Doubt drug-induced. Suspicion to be MRSA. Attending physician saw and assessed patient agreed-as per physician recommended patient be discharged and started on clindamycin. Patient stable, afebrile. Patient at first was mildly tachycardic upon arrival to emergency department with a heart rate of 110 beats per minute- heart rate was rechecked and was 96 beats per minute. Patient nonseptic appearing. Discharged patient with clindamycin. Referred patient to urgent care Center to be reassessed. Discussed with patient proper hygiene. Discussed with patient to closely monitor symptoms and if symptoms are to worsen or change report back to emergency department-strict return structures given. Patient agreed to plan of care, understood, all questions answered.  Raymon Mutton, PA-C 09/17/13 1313

## 2013-09-15 NOTE — ED Notes (Signed)
Pt c/o generalized painful, red rash x 2 weeks. Pt reports recently given prescribed medications for asthma and cold symptoms 2 weeks ago. Pt reports similar episode in past when she took medication for asthma but was not as bad.

## 2013-09-17 NOTE — ED Provider Notes (Signed)
Medical screening examination/treatment/procedure(s) were conducted as a shared visit with non-physician practitioner(s) and myself.  I personally evaluated the patient during the encounter.  EKG Interpretation   None       Kelly Meyers is a 37 y.o. 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 Rash on torso for the last 2 days. She said it looks like small simples and now is scabbing. No purulent discharge. No fevers. Has hx of MRSA and she said its similar to her previous MRSA. She has small vesicular rash with surrounding cellulitis. No evidence of abscess. Will d/c home on clinda.    Richardean Canal, MD 09/17/13 [phone]

## 2015-12-30 ENCOUNTER — Ambulatory Visit (INDEPENDENT_AMBULATORY_CARE_PROVIDER_SITE_OTHER): Payer: BLUE CROSS/BLUE SHIELD | Admitting: Physician Assistant

## 2015-12-30 VITALS — BP 131/83 | HR 74 | Temp 97.7°F | Resp 16 | Ht 61.0 in | Wt 228.0 lb

## 2015-12-30 DIAGNOSIS — J302 Other seasonal allergic rhinitis: Secondary | ICD-10-CM

## 2015-12-30 DIAGNOSIS — Z720 Tobacco use: Secondary | ICD-10-CM

## 2015-12-30 DIAGNOSIS — Z8709 Personal history of other diseases of the respiratory system: Secondary | ICD-10-CM | POA: Diagnosis not present

## 2015-12-30 DIAGNOSIS — F17210 Nicotine dependence, cigarettes, uncomplicated: Secondary | ICD-10-CM

## 2015-12-30 DIAGNOSIS — R059 Cough, unspecified: Secondary | ICD-10-CM

## 2015-12-30 DIAGNOSIS — R05 Cough: Secondary | ICD-10-CM | POA: Diagnosis not present

## 2015-12-30 LAB — GLUCOSE, POCT (MANUAL RESULT ENTRY): POC GLUCOSE: 93 mg/dL (ref 70–99)

## 2015-12-30 MED ORDER — FLUTICASONE PROPIONATE 50 MCG/ACT NA SUSP: NASAL | Status: DC

## 2015-12-30 MED ORDER — METHYLPREDNISOLONE ACETATE 80 MG/ML IJ SUSP
80.0000 mg | Freq: Once | INTRAMUSCULAR | Status: AC
  Administered 2015-12-30: 80 mg via INTRAMUSCULAR

## 2015-12-30 MED ORDER — CETIRIZINE-PSEUDOEPHEDRINE ER 5-120 MG PO TB12: 1.0000 | ORAL_TABLET | Freq: Two times a day (BID) | ORAL | Status: DC

## 2015-12-30 MED ORDER — VARENICLINE TARTRATE 0.5 MG X 11 & 1 MG X 42 PO MISC: ORAL | Status: DC

## 2015-12-30 MED ORDER — ALBUTEROL SULFATE (2.5 MG/3ML) 0.083% IN NEBU
2.5000 mg | INHALATION_SOLUTION | Freq: Once | RESPIRATORY_TRACT | Status: AC
  Administered 2015-12-30: 2.5 mg via RESPIRATORY_TRACT

## 2015-12-30 MED ORDER — VARENICLINE TARTRATE 1 MG PO TABS: 1.0000 mg | ORAL_TABLET | Freq: Two times a day (BID) | ORAL | Status: DC

## 2015-12-30 MED ORDER — IPRATROPIUM BROMIDE 0.02 % IN SOLN
0.5000 mg | Freq: Once | RESPIRATORY_TRACT | Status: AC
  Administered 2015-12-30: 0.5 mg via RESPIRATORY_TRACT

## 2015-12-30 NOTE — Progress Notes (Signed)
12/30/2015 12:08 PM   DOB: 07/18/1976 / MRN: [id number]  SUBJECTIVE:  Kelly Meyers is a 40 y.o. female presenting for nasal congestion and cough.  She has a history of asthma.  Reports these symptoms started on 5 days ago.  Associates palatal itching, some sneezing and itchy throat. She feels that she is getting worse.  She has tried her albuterol inhaler without relief.    She smokes 5-10 cigarettes daily and would like to quit.  She has tried cold [place] but reports her cravings are too much.  She has not tried gum or patches yet.  She would like to try medication to curb her cravings.    She is allergic to shellfish allergy; onion; and oxycodone.   She  has a past medical history of Hypertension; Asthma; Allergy; and Anemia.    She  reports that she has never smoked. She does not have any smokeless tobacco history on file. She reports that she drinks alcohol. She reports that she does not use illicit drugs. She  reports that she currently engages in sexual activity. She reports using the following method of birth control/protection: IUD. The patient  has past surgical history that includes Cesarean section.  Her family history includes Diabetes in her father and mother; Hypertension in her father; Multiple sclerosis in her mother.  Review of Systems  Constitutional: Negative for fever and chills.  Respiratory: Positive for cough. Negative for sputum production and shortness of breath.   Cardiovascular: Negative for chest pain.  Gastrointestinal: Negative for nausea.  Skin: Negative for rash.  Neurological: Negative for dizziness and headaches.    Problem list and medications reviewed and updated by myself where necessary, and exist elsewhere in the encounter.   OBJECTIVE:  BP 131/83 mmHg  Pulse 74  Temp(Src) 97.7 F (36.5 C) (Oral)  Resp 16  Ht 5\' 1"  (1.549 m)  Wt 228 lb (103.42 kg)  BMI 43.10 kg/m2  SpO2 99%  Physical Exam  Constitutional: She is oriented to  person, place, and time. Vital signs are normal. She appears well-nourished. She does not appear ill. No distress.  HENT:  Head:    Right Ear: Tympanic membrane normal.  Left Ear: Tympanic membrane normal.  Nose: Mucosal edema (bluish hue) present.  Mouth/Throat: Uvula is midline, oropharynx is clear and moist and mucous membranes are normal.  Eyes: EOM are normal. Pupils are equal, round, and reactive to light.  Cardiovascular: Normal rate and regular rhythm.   Pulmonary/Chest: Effort normal and breath sounds normal. No respiratory distress. She has no wheezes. She has no rales.  Abdominal: Soft. She exhibits no distension.  Neurological: She is alert and oriented to person, place, and time. No cranial nerve deficit. Gait normal.  Skin: Skin is warm and dry. She is not diaphoretic.  Psychiatric: She has a normal mood and affect.  Vitals reviewed.  No results found for: HGBA1C   Results for orders placed or performed in visit on 12/30/15 (from the past 72 hour(s))  POCT glucose (manual entry)     Status: None   Collection Time: 12/30/15 12:02 PM  Result Value Ref Range   POC Glucose 93 70 - 99 mg/dl    No results found.  ASSESSMENT AND PLAN  [name] was seen today for asthma, nasal congestion and cough.  Diagnoses and all orders for this visit:  Cough: 40 y.o. female who is currently smoking and has a history of asthma.  She looks good on exam and her lungs  are clear.  Nebs did help her symptomatically.  Will provide depomedrol 80 IM.  Her symptoms are most consistent with allergies.  Rx'd flonase, Zyrtec-D to help with allergic symptoms.  Will prescribed Chantix for smoking cessation.  Advised that if insurance does not pay this will save her money in the long run.  -     albuterol (PROVENTIL) (2.5 MG/3ML) 0.083% nebulizer solution 2.5 mg; Take 3 mLs (2.5 mg total) by nebulization once. -     ipratropium (ATROVENT) nebulizer solution 0.5 mg; Take 2.5 mLs (0.5 mg total) by  nebulization once. -     methylPREDNISolone acetate (DEPO-MEDROL) injection 80 mg; Inject 1 mL (80 mg total) into the muscle once.  Seasonal allergies -     fluticasone (FLONASE) 50 MCG/ACT nasal spray; Take two sprays in each nostril every morning throughout allergy season. Do not miss doses. -     cetirizine-pseudoephedrine (ZYRTEC-D ALLERGY & CONGESTION) 5-120 MG tablet; Take 1 tablet by mouth 2 (two) times daily.  History of asthma -     POCT glucose (manual entry) -     methylPREDNISolone acetate (DEPO-MEDROL) injection 80 mg; Inject 1 mL (80 mg total) into the muscle once.  Light smoker -     varenicline (CHANTIX CONTINUING MONTH PAK) 1 MG tablet; Take 1 tablet (1 mg total) by mouth 2 (two) times daily. -     varenicline (CHANTIX STARTING MONTH PAK) 0.5 MG X 11 & 1 MG X 42 tablet; Take one 0.5 mg tablet by mouth once daily for 3 days, then increase to one 0.5 mg tablet twice daily for 4 days, then increase to one 1 mg tablet twice daily.    The patient was advised to call or return to clinic if she does not see an improvement in symptoms or to seek the care of the closest emergency department if she worsens with the above plan.   Deliah Boston, MHS, PA-C [facility] 12/30/2015 12:08 PM

## 2015-12-30 NOTE — Patient Instructions (Signed)
     IF you received an x-ray today, you will receive an invoice from [facility]. Please contact [facility] at [phone] with questions or concerns regarding your invoice.   IF you received labwork today, you will receive an invoice from [facility]. Please contact [facility] at [phone] with questions or concerns regarding your invoice.   Our billing staff will not be able to assist you with questions regarding bills from these companies.  You will be contacted with the lab results as soon as they are available. The fastest way to get your results is to activate your My Chart account. Instructions are located on the last page of this paperwork. If you have not heard from us regarding the results in 2 weeks, please contact this office.      

## 2016-03-09 ENCOUNTER — Ambulatory Visit (INDEPENDENT_AMBULATORY_CARE_PROVIDER_SITE_OTHER): Payer: BLUE CROSS/BLUE SHIELD | Admitting: Family Medicine

## 2016-03-09 VITALS — BP 132/88 | HR 104 | Temp 98.3°F | Resp 18 | Ht 61.0 in | Wt 227.0 lb

## 2016-03-09 DIAGNOSIS — J45901 Unspecified asthma with (acute) exacerbation: Secondary | ICD-10-CM

## 2016-03-09 DIAGNOSIS — J029 Acute pharyngitis, unspecified: Secondary | ICD-10-CM

## 2016-03-09 MED ORDER — ALBUTEROL SULFATE HFA 108 (90 BASE) MCG/ACT IN AERS: 1.0000 | INHALATION_SPRAY | RESPIRATORY_TRACT | Status: DC | PRN

## 2016-03-09 MED ORDER — AZITHROMYCIN 250 MG PO TABS: ORAL_TABLET | ORAL | Status: DC

## 2016-03-09 NOTE — Progress Notes (Signed)
   HPI  Patient presents today here with cough and cold symps  Pt reoprts 5 days of chills, mild increased SOB, dysonea, congestion, and sore throat.   She has Hx of asthma but does not have an inhaler. HAs quit smoking she says years ago.   Symptoms are overall improving except for sore throat, cough, and dysonea which are worsening.   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, no sick contacts that she knows of  PMH: Smoking status noted ROS: Per HPI  Objective: BP 132/88 mmHg  Pulse 104  Temp(Src) 98.3 F (36.8 C)  Resp 18  Ht 5\' 1"  (1.549 m)  Wt 227 lb (102.967 kg)  BMI 42.91 kg/m2  SpO2 99% Gen: NAD, alert, cooperative with exam HEENT: NCAT, nares with swollen turbinates BL, oropharynx with swollen enlarged tonsils Neck: R sided tender LAD CV: RRR, good S1/S2, no murmur Resp: CTABL, no wheezes, non-labored Ext: No edema, warm Neuro: Alert and oriented, No gross deficits  Assessment and plan:  # Pharyngitis, Asthma exacerbation (mild Given duration and course of illness I am treating with azithro to cover possible strep and developing pneumonia  She has asthma which is normally well controlled without medications Lung exam reasurring Supportive care discussed Albuterol RTC with any concerns     Meds ordered this encounter  Medications  . albuterol (PROVENTIL HFA;VENTOLIN HFA) 108 (90 Base) MCG/ACT inhaler    Sig: Inhale 1-2 puffs into the lungs every 4 (four) hours as needed for wheezing or shortness of breath. Reported on 03/09/2016    Dispense:  1 Inhaler    Refill:  0  . azithromycin (ZITHROMAX) 250 MG tablet    Sig: Take 2 tablets on day 1 and 1 tablet daily after that    Dispense:  6 tablet    Refill:  0    Kevin FentonSamuel Bradshaw, MD 9:34 AM

## 2016-03-09 NOTE — Patient Instructions (Addendum)
     IF you received an x-ray today, you will receive an invoice from [facility]. Please contact [facility] at [phone] with questions or concerns regarding your invoice.   IF you received labwork today, you will receive an invoice from [facility]. Please contact [facility] at [phone] with questions or concerns regarding your invoice.   Our billing staff will not be able to assist you with questions regarding bills from these companies.  You will be contacted with the lab results as soon as they are available. The fastest way to get your results is to activate your My Chart account. Instructions are located on the last page of this paperwork. If you have not heard from us regarding the results in 2 weeks, please contact this office.     Great to meet you!  Be sure to take all of the antibiotics  Continue plain zyrtec daily, it is OTC.   Come back if you have any concerns or problems.

## 2016-12-26 ENCOUNTER — Encounter (HOSPITAL_COMMUNITY): Payer: Self-pay | Admitting: Emergency Medicine

## 2016-12-26 ENCOUNTER — Emergency Department (HOSPITAL_COMMUNITY)
Admission: EM | Admit: 2016-12-26 | Discharge: 2016-12-26 | Disposition: A | Discharge status: Home / Self Care | Payer: Self-pay | Attending: Emergency Medicine | Admitting: Emergency Medicine

## 2016-12-26 ENCOUNTER — Emergency Department (HOSPITAL_COMMUNITY): Payer: Self-pay

## 2016-12-26 DIAGNOSIS — I10 Essential (primary) hypertension: Secondary | ICD-10-CM | POA: Insufficient documentation

## 2016-12-26 DIAGNOSIS — B9789 Other viral agents as the cause of diseases classified elsewhere: Secondary | ICD-10-CM

## 2016-12-26 DIAGNOSIS — J45909 Unspecified asthma, uncomplicated: Secondary | ICD-10-CM | POA: Insufficient documentation

## 2016-12-26 DIAGNOSIS — J069 Acute upper respiratory infection, unspecified: Secondary | ICD-10-CM | POA: Insufficient documentation

## 2016-12-26 LAB — RAPID STREP SCREEN (MED CTR MEBANE ONLY): STREPTOCOCCUS, GROUP A SCREEN (DIRECT): NEGATIVE

## 2016-12-26 MED ORDER — BENZONATATE 100 MG PO CAPS: 100.0000 mg | ORAL_CAPSULE | Freq: Three times a day (TID) | ORAL | 0 refills | Status: DC

## 2016-12-26 NOTE — ED Provider Notes (Signed)
[facility]-EMERGENCY DEPT Provider Note   CSN: [id number] Arrival date & time: 12/26/16  8295     History   Chief Complaint Chief Complaint  Patient presents with  . Flu Like Symptoms    HPI Kelly Meyers is a 41 y.o. female.  HPI Pt is complaining of cough and sore throat for the last week.  She is having nasal congestion.  She has had fevers, on Saturday up to 101.  She has felt chilled.  Cough is dry.   Green nasal drainage.  Sore throat,  Hurts to swallow.  Sx were not improving so she came to the ED for evaluation.  Past Medical History:  Diagnosis Date  . Allergy   . Anemia   . Asthma   . Hypertension     Patient Active Problem List   Diagnosis Date Noted  . TENSION HEADACHE 12/26/2007  . ALLERGIC RHINITIS 12/26/2007  . ASTHMA 12/26/2007  . DYSPNEA 12/26/2007  . COUGH 12/26/2007    Past Surgical History:  Procedure Laterality Date  . CESAREAN SECTION      OB History    No data available       Home Medications    Prior to Admission medications   Medication Sig Start Date End Date Taking? Authorizing Provider  albuterol (PROVENTIL HFA;VENTOLIN HFA) 108 (90 Base) MCG/ACT inhaler Inhale 1-2 puffs into the lungs every 4 (four) hours as needed for wheezing or shortness of breath. Reported on 03/09/2016 03/09/16  Yes Elenora Gamma, MD  Fluticasone-Salmeterol (ADVAIR DISKUS) 250-50 MCG/DOSE AEPB Inhale 1 puff into the lungs 2 (two) times daily. Reported on 12/30/2015   Yes Historical Provider, MD  ibuprofen (ADVIL,MOTRIN) 200 MG tablet Take 600 mg by mouth every 4 (four) hours as needed for mild pain. Reported on 12/30/2015   Yes Historical Provider, MD  benzonatate (TESSALON) 100 MG capsule Take 1 capsule (100 mg total) by mouth every 8 (eight) hours. 12/26/16   Linwood Dibbles, MD    Family History Family History  Problem Relation Age of Onset  . Diabetes Mother   . Multiple sclerosis Mother   . Hypertension Father   . Diabetes Father     Social  History Social History  Substance Use Topics  . Smoking status: Never Smoker  . Smokeless tobacco: Not on file  . Alcohol use Yes     Allergies   Shellfish allergy; Onion; and Oxycodone   Review of Systems Review of Systems  All other systems reviewed and are negative.    Physical Exam Updated Vital Signs BP (!) 149/107 (BP Location: Right Arm)   Pulse (!) 108   Temp 98.1 F (36.7 C) (Oral)   Resp 18   Wt 97.5 kg   SpO2 94%   BMI 40.62 kg/m   Physical Exam  Constitutional: She appears well-developed and well-nourished. No distress.  HENT:  Head: Normocephalic and atraumatic.  Right Ear: External ear normal.  Left Ear: External ear normal.  Mouth/Throat: Posterior oropharyngeal erythema present. No oropharyngeal exudate.  Cerumen right ear canal  Eyes: Conjunctivae are normal. Right eye exhibits no discharge. Left eye exhibits no discharge. No scleral icterus.  Neck: Neck supple. No tracheal deviation present.  Cardiovascular: Normal rate, regular rhythm and intact distal pulses.   Pulmonary/Chest: Effort normal and breath sounds normal. No stridor. No respiratory distress. She has no wheezes. She has no rales.  Abdominal: Soft. Bowel sounds are normal. She exhibits no distension. There is no tenderness. There is no rebound and  no guarding.  Musculoskeletal: She exhibits no edema or tenderness.  Neurological: She is alert. She has normal strength. No cranial nerve deficit (no facial droop, extraocular movements intact, no slurred speech) or sensory deficit. She exhibits normal muscle tone. She displays no seizure activity. Coordination normal.  Skin: Skin is warm and dry. No rash noted.  Psychiatric: She has a normal mood and affect.  Nursing note and vitals reviewed.    ED Treatments / Results  Labs (all labs ordered are listed, but only abnormal results are displayed) Labs Reviewed  RAPID STREP SCREEN (NOT AT [facility])  CULTURE, GROUP A STREP [facility])      Radiology Dg Chest 2 View  Result Date: 12/26/2016 CLINICAL DATA:  Cough and fever EXAM: CHEST  2 VIEW COMPARISON:  12/26/2007 FINDINGS: Heart size and vascularity normal. Negative for heart failure. Lungs are clear without infiltrate effusion or mass. Right cervical rib again noted and unchanged. IMPRESSION: No active cardiopulmonary disease. Electronically Signed   By: Marlan Palau M.D.   On: 12/26/2016 09:26    Procedures Procedures (including critical care time)  Medications Ordered in ED Medications - No data to display   Initial Impression / Assessment and Plan / ED Course  I have reviewed the triage vital signs and the nursing notes.  Pertinent labs & imaging results that were available during my care of the patient were reviewed by me and considered in my medical decision making (see chart for details).    Symptoms are consistent with a viral upper respiratory infection. There is no evidence to suggest pneumonia . Marland Kitchen I discussed supportive treatment. I encouraged followup with the primary care doctor next week if symptoms have not resolved. Warning signs and reasons to return to the emergency room were discussed    Final Clinical Impressions(s) / ED Diagnoses   Final diagnoses:  Viral URI with cough    New Prescriptions New Prescriptions   BENZONATATE (TESSALON) 100 MG CAPSULE    Take 1 capsule (100 mg total) by mouth every 8 (eight) hours.     Linwood Dibbles, MD 12/26/16 1145

## 2016-12-26 NOTE — ED Triage Notes (Signed)
Pt complaint of flu like symptoms onset Saturday with associated productive cough, sore throat, chills, and generalized body aches.

## 2016-12-26 NOTE — Discharge Instructions (Signed)
Follow-up with primary care doctor if not improving in the next week, take the medications as needed for cough,

## 2016-12-29 LAB — CULTURE, GROUP A STREP (THRC)

## 2017-05-22 ENCOUNTER — Other Ambulatory Visit: Payer: Self-pay

## 2017-05-22 ENCOUNTER — Emergency Department (HOSPITAL_COMMUNITY)
Admission: EM | Admit: 2017-05-22 | Discharge: 2017-05-22 | Disposition: A | Discharge status: Home / Self Care | Payer: Self-pay | Attending: Emergency Medicine | Admitting: Emergency Medicine

## 2017-05-22 ENCOUNTER — Encounter (HOSPITAL_COMMUNITY): Payer: Self-pay | Admitting: Emergency Medicine

## 2017-05-22 ENCOUNTER — Emergency Department (HOSPITAL_COMMUNITY): Payer: Self-pay

## 2017-05-22 DIAGNOSIS — J45901 Unspecified asthma with (acute) exacerbation: Secondary | ICD-10-CM | POA: Insufficient documentation

## 2017-05-22 DIAGNOSIS — R000 Tachycardia, unspecified: Secondary | ICD-10-CM | POA: Insufficient documentation

## 2017-05-22 DIAGNOSIS — Z79899 Other long term (current) drug therapy: Secondary | ICD-10-CM | POA: Insufficient documentation

## 2017-05-22 DIAGNOSIS — I10 Essential (primary) hypertension: Secondary | ICD-10-CM | POA: Insufficient documentation

## 2017-05-22 DIAGNOSIS — R062 Wheezing: Secondary | ICD-10-CM

## 2017-05-22 DIAGNOSIS — R0602 Shortness of breath: Secondary | ICD-10-CM | POA: Insufficient documentation

## 2017-05-22 LAB — CBC WITH DIFFERENTIAL/PLATELET
BASOS PCT: 0 %
Basophils Absolute: 0 10*3/uL (ref 0.0–0.1)
EOS ABS: 0.1 10*3/uL (ref 0.0–0.7)
EOS PCT: 1 %
HCT: 39.5 % (ref 36.0–46.0)
Hemoglobin: 13.3 g/dL (ref 12.0–15.0)
LYMPHS ABS: 1.4 10*3/uL (ref 0.7–4.0)
Lymphocytes Relative: 15 %
MCH: 28.9 pg (ref 26.0–34.0)
MCHC: 33.7 g/dL (ref 30.0–36.0)
MCV: 85.7 fL (ref 78.0–100.0)
Monocytes Absolute: 0.7 10*3/uL (ref 0.1–1.0)
Monocytes Relative: 7 %
Neutro Abs: 7.4 10*3/uL (ref 1.7–7.7)
Neutrophils Relative %: 77 %
PLATELETS: 229 10*3/uL (ref 150–400)
RBC: 4.61 MIL/uL (ref 3.87–5.11)
RDW: 13.9 % (ref 11.5–15.5)
WBC: 9.6 10*3/uL (ref 4.0–10.5)

## 2017-05-22 LAB — BASIC METABOLIC PANEL
Anion gap: 7 (ref 5–15)
BUN: 7 mg/dL (ref 6–20)
CO2: 26 mmol/L (ref 22–32)
CREATININE: 0.79 mg/dL (ref 0.44–1.00)
Calcium: 8.7 mg/dL — ABNORMAL LOW (ref 8.9–10.3)
Chloride: 105 mmol/L (ref 101–111)
GFR calc Af Amer: >60 mL/min (ref >60)
Glucose, Bld: 103 mg/dL — ABNORMAL HIGH (ref 65–99)
Potassium: 3.7 mmol/L (ref 3.5–5.1)
SODIUM: 138 mmol/L (ref 135–145)

## 2017-05-22 LAB — D-DIMER, QUANTITATIVE: D-Dimer, Quant: 0.47 ug/mL-FEU (ref 0.00–0.50)

## 2017-05-22 MED ORDER — METHYLPREDNISOLONE SODIUM SUCC 125 MG IJ SOLR
125.0000 mg | Freq: Once | INTRAMUSCULAR | Status: AC
  Administered 2017-05-22: 125 mg via INTRAVENOUS
  Filled 2017-05-22: qty 2

## 2017-05-22 MED ORDER — PREDNISONE 50 MG PO TABS: 50.0000 mg | ORAL_TABLET | Freq: Every day | ORAL | 0 refills | Status: DC

## 2017-05-22 MED ORDER — IPRATROPIUM-ALBUTEROL 0.5-2.5 (3) MG/3ML IN SOLN
3.0000 mL | Freq: Once | RESPIRATORY_TRACT | Status: AC
  Administered 2017-05-22: 3 mL via RESPIRATORY_TRACT
  Filled 2017-05-22: qty 3

## 2017-05-22 MED ORDER — ALBUTEROL SULFATE (2.5 MG/3ML) 0.083% IN NEBU
5.0000 mg | INHALATION_SOLUTION | Freq: Once | RESPIRATORY_TRACT | Status: AC
  Administered 2017-05-22: 5 mg via RESPIRATORY_TRACT
  Filled 2017-05-22: qty 6

## 2017-05-22 NOTE — ED Triage Notes (Signed)
Pt states that she has had asthma all night with SOB and tried to use her inhaler w/o relief. Alert and oriented.

## 2017-05-22 NOTE — Discharge Instructions (Signed)
Your workup today showed evidence of an asthma attack. You did better with your breathing treatments and steroids. Please continue the steroids for the next few days. Please continue your home albuterol treatments. Please follow-up with your primary physician for refills of your asthma medicines. If any symptoms change or worsen, please return to the nearest emergency department.

## 2017-05-22 NOTE — ED Provider Notes (Signed)
[facility]-EMERGENCY DEPT Provider Note   CSN: [id number] Arrival date & time: 05/22/17  7829     History   Chief Complaint Chief Complaint  Patient presents with  . Asthma    HPI Kelly Meyers is a 41 y.o. female.   The history is provided by the patient, a relative and medical records.  Asthma  This is a recurrent problem. The current episode started more than 2 days ago. The problem occurs constantly. The problem has been gradually worsening. Associated symptoms include chest pain and shortness of breath. Pertinent negatives include no abdominal pain and no headaches. The symptoms are aggravated by coughing. Nothing relieves the symptoms. Treatments tried: breathing treatments. The treatment provided mild relief.    Past Medical History:  Diagnosis Date  . Allergy   . Anemia   . Asthma   . Hypertension     Patient Active Problem List   Diagnosis Date Noted  . TENSION HEADACHE 12/26/2007  . ALLERGIC RHINITIS 12/26/2007  . ASTHMA 12/26/2007  . DYSPNEA 12/26/2007  . COUGH 12/26/2007    Past Surgical History:  Procedure Laterality Date  . CESAREAN SECTION      OB History    No data available       Home Medications    Prior to Admission medications   Medication Sig Start Date End Date Taking? Authorizing Provider  albuterol (PROVENTIL HFA;VENTOLIN HFA) 108 (90 Base) MCG/ACT inhaler Inhale 1-2 puffs into the lungs every 4 (four) hours as needed for wheezing or shortness of breath. Reported on 03/09/2016 03/09/16   Elenora Gamma, MD  benzonatate (TESSALON) 100 MG capsule Take 1 capsule (100 mg total) by mouth every 8 (eight) hours. 12/26/16   Linwood Dibbles, MD  Fluticasone-Salmeterol (ADVAIR DISKUS) 250-50 MCG/DOSE AEPB Inhale 1 puff into the lungs 2 (two) times daily. Reported on 12/30/2015    [provider]  ibuprofen (ADVIL,MOTRIN) 200 MG tablet Take 600 mg by mouth every 4 (four) hours as needed for mild pain. Reported on 12/30/2015    [provider]    Family History Family History  Problem Relation Age of Onset  . Diabetes Mother   . Multiple sclerosis Mother   . Hypertension Father   . Diabetes Father     Social History Social History  Substance Use Topics  . Smoking status: Never Smoker  . Smokeless tobacco: Not on file  . Alcohol use Yes     Allergies   Shellfish allergy; Onion; and Oxycodone   Review of Systems Review of Systems  Constitutional: Negative for chills, diaphoresis, fatigue and fever.  HENT: Positive for congestion. Negative for rhinorrhea.   Eyes: Negative for visual disturbance.  Respiratory: Positive for cough, chest tightness and shortness of breath. Negative for wheezing and stridor.   Cardiovascular: Positive for chest pain. Negative for palpitations and leg swelling.  Gastrointestinal: Negative for abdominal pain, constipation, diarrhea, nausea and vomiting.  Genitourinary: Negative for dysuria.  Musculoskeletal: Negative for neck pain and neck stiffness.  Skin: Negative for rash and wound.  Neurological: Negative for syncope, light-headedness and headaches.  Psychiatric/Behavioral: Negative for agitation.  All other systems reviewed and are negative.    Physical Exam Updated Vital Signs BP (!) 163/94 (BP Location: Left Arm)   Pulse (!) 104   Temp 98.2 F (36.8 C) (Oral)   Resp 20   SpO2 98%   Physical Exam  Constitutional: She is oriented to person, place, and time. She appears well-developed and well-nourished. No distress.  HENT:  Head: Normocephalic.  Nose: Rhinorrhea present.  Mouth/Throat: Oropharynx is clear and moist. No oropharyngeal exudate.  Eyes: Pupils are equal, round, and reactive to light. Conjunctivae and EOM are normal.  Neck: Normal range of motion.  Cardiovascular: Intact distal pulses.  Tachycardia present.   No murmur heard. Pulmonary/Chest: Effort normal. No stridor. Tachypnea noted. No respiratory distress. She has wheezes. She has no  rhonchi. She has no rales. She exhibits tenderness.    Abdominal: Soft. There is no tenderness.  Musculoskeletal: She exhibits no tenderness.  Neurological: She is alert and oriented to person, place, and time. No sensory deficit. She exhibits normal muscle tone.  Skin: Capillary refill takes less than 2 seconds. No rash noted. She is not diaphoretic. No erythema.  Psychiatric: She has a normal mood and affect.  Nursing note and vitals reviewed.    ED Treatments / Results  Labs (all labs ordered are listed, but only abnormal results are displayed) Labs Reviewed  BASIC METABOLIC PANEL - Abnormal; Notable for the following:       Result Value   Glucose, Bld 103 (*)    Calcium 8.7 (*)    All other components within normal limits  D-DIMER, QUANTITATIVE (NOT AT [facility])  CBC WITH DIFFERENTIAL/PLATELET    EKG  EKG Interpretation None      ED ECG REPORT   Date: 05/22/2017  Rate: 114  Rhythm: sinus tachycardia  QRS Axis: normal  Intervals: normal  ST/T Wave abnormalities: borderline t wave abnormalaties  Conduction Disutrbances:none  Narrative Interpretation:   Old EKG Reviewed: unchanged  I have personally reviewed the EKG tracing and agree with the computerized printout as noted.    Radiology Dg Chest 2 View  Result Date: 05/22/2017 CLINICAL DATA:  Worsening cough, sob over last 2 days, states hx of asthma and home meds not helping EXAM: CHEST - 2 VIEW COMPARISON:  12/26/2016 FINDINGS: Lungs are clear. Heart size and mediastinal contours are within normal limits. No effusion. Visualized bones unremarkable.   The abdomen was shielded. IMPRESSION: No acute cardiopulmonary disease. Electronically Signed   By: Corlis Leak M.D.   On: 05/22/2017 08:49    Procedures Procedures (including critical care time)  Medications Ordered in ED Medications  albuterol (PROVENTIL) (2.5 MG/3ML) 0.083% nebulizer solution 5 mg (5 mg Nebulization Given 05/22/17 0748)  ipratropium-albuterol  (DUONEB) 0.5-2.5 (3) MG/3ML nebulizer solution 3 mL (3 mLs Nebulization Given 05/22/17 1048)  methylPREDNISolone sodium succinate (SOLU-MEDROL) 125 mg/2 mL injection 125 mg (125 mg Intravenous Given 05/22/17 1100)     Initial Impression / Assessment and Plan / ED Course  I have reviewed the triage vital signs and the nursing notes.  Pertinent labs & imaging results that were available during my care of the patient were reviewed by me and considered in my medical decision making (see chart for details).     Kelly Meyers is a 41 y.o. female with past medical history significant for asthma and hypertension who presents with shortness of breath and chest pain. Patient says that for the last 3 days, she has had worsening shortness of breath. She reports that she has tried using her albuterol inhaler at home and it has been minimally successful. She reports a dry cough that has been worsening. She denies any hemoptysis. She does report a chest tightness and sharp chest pain. She says it is worsened with cough and palpation. She denies fevers or chills. She does report some mild rhinorrhea. She denies any chest trauma. She  denies any nausea, vomiting, constipation, diarrhea, or dysuria. Patient says that it has been over a year since her last asthma flare but this feels similar.  On exam, patient has wheezing in all lung fields. Patient also has mild rhinorrhea. Patient's chest was tender to palpation. Patient had no abdominal tenderness. No significant lower extremity edema or tenderness. No focal neurologic deficits.  Patient was tachycardic on arrival. Patient will be given breathing treatment due to the wheezing. Patient also given steroids for likely asthma exacerbation. Given her tachycardia and chest discomfort, she will have EKG and d-dimer as she is unable to be ruled out with PERC due to heart rate.  Anticipate reassessment following workup and treatment.                            Patient's workup showed no evidence of pneumonia on imaging. D-dimer was negative. CBC and CMP were reassuring. Patient felt better after breathing treatment and steroids. Suspect asthma exacerbation.  Patient was reassessed after a period of observation and continued to have improved breathing. Patient will be given prescription for steroids. Patient still has albuterol at home. Patient will follow-up with her PCP for further outpatient management of her asthma. Return precautions were given and understood. Patient had no other questions or concerns and was discharged in good condition.   Final Clinical Impressions(s) / ED Diagnoses   Final diagnoses:  Moderate asthma with exacerbation, unspecified whether persistent  Shortness of breath  Wheezing    New Prescriptions Discharge Medication List as of 05/22/2017  2:42 PM    START taking these medications   Details  predniSONE (DELTASONE) 50 MG tablet Take 1 tablet (50 mg total) by mouth daily., Starting Mon 05/22/2017, Until Sat 05/27/2017, Print        Clinical Impression: 1. Moderate asthma with exacerbation, unspecified whether persistent   2. Shortness of breath   3. Wheezing     Disposition: Discharge  Condition: Good  I have discussed the results, Dx and Tx plan with the pt(& family if present). He/she/they expressed understanding and agree(s) with the plan. Discharge instructions discussed at great length. Strict return precautions discussed and pt &/or family have verbalized understanding of the instructions. No further questions at time of discharge.    Discharge Medication List as of 05/22/2017  2:42 PM    START taking these medications   Details  predniSONE (DELTASONE) 50 MG tablet Take 1 tablet (50 mg total) by mouth daily., Starting Mon 05/22/2017, Until Sat 05/27/2017, Print        Follow Up: [facility] [address] [place]  [id number] [phone] Schedule an appointment as soon as possible for a visit    [facility] [address] [id number] mc [place] [address] [phone]  If symptoms worsen     Tegeler, Canary Brim, MD 05/22/17 2046

## 2017-05-26 ENCOUNTER — Encounter: Payer: Self-pay | Admitting: Physician Assistant

## 2017-05-26 ENCOUNTER — Ambulatory Visit (INDEPENDENT_AMBULATORY_CARE_PROVIDER_SITE_OTHER): Payer: Self-pay

## 2017-05-26 ENCOUNTER — Ambulatory Visit (INDEPENDENT_AMBULATORY_CARE_PROVIDER_SITE_OTHER): Payer: Self-pay | Admitting: Physician Assistant

## 2017-05-26 VITALS — BP 176/112 | HR 85 | Temp 98.6°F | Resp 16 | Ht 61.0 in | Wt 218.0 lb

## 2017-05-26 DIAGNOSIS — I10 Essential (primary) hypertension: Secondary | ICD-10-CM

## 2017-05-26 DIAGNOSIS — Z23 Encounter for immunization: Secondary | ICD-10-CM

## 2017-05-26 DIAGNOSIS — R059 Cough, unspecified: Secondary | ICD-10-CM

## 2017-05-26 DIAGNOSIS — R062 Wheezing: Secondary | ICD-10-CM

## 2017-05-26 DIAGNOSIS — R030 Elevated blood-pressure reading, without diagnosis of hypertension: Secondary | ICD-10-CM

## 2017-05-26 DIAGNOSIS — R05 Cough: Secondary | ICD-10-CM

## 2017-05-26 DIAGNOSIS — Z8709 Personal history of other diseases of the respiratory system: Secondary | ICD-10-CM

## 2017-05-26 MED ORDER — ALBUTEROL SULFATE HFA 108 (90 BASE) MCG/ACT IN AERS: 1.0000 | INHALATION_SPRAY | RESPIRATORY_TRACT | 0 refills | Status: DC | PRN

## 2017-05-26 MED ORDER — FLUTICASONE PROPIONATE HFA 110 MCG/ACT IN AERO: 2.0000 | INHALATION_SPRAY | RESPIRATORY_TRACT | 12 refills | Status: DC

## 2017-05-26 MED ORDER — METHYLPREDNISOLONE ACETATE 80 MG/ML IJ SUSP
80.0000 mg | Freq: Once | INTRAMUSCULAR | Status: AC
  Administered 2017-05-26: 80 mg via INTRAMUSCULAR

## 2017-05-26 MED ORDER — IPRATROPIUM BROMIDE 0.02 % IN SOLN
0.5000 mg | Freq: Once | RESPIRATORY_TRACT | Status: AC
  Administered 2017-05-26: 0.5 mg via RESPIRATORY_TRACT

## 2017-05-26 MED ORDER — ALBUTEROL SULFATE (2.5 MG/3ML) 0.083% IN NEBU
2.5000 mg | INHALATION_SOLUTION | Freq: Once | RESPIRATORY_TRACT | Status: AC
  Administered 2017-05-26: 2.5 mg via RESPIRATORY_TRACT

## 2017-05-26 MED ORDER — ALBUTEROL SULFATE (2.5 MG/3ML) 0.083% IN NEBU: 2.5000 mg | INHALATION_SOLUTION | RESPIRATORY_TRACT | 1 refills | Status: DC | PRN

## 2017-05-26 MED ORDER — AMLODIPINE BESYLATE 5 MG PO TABS: 5.0000 mg | ORAL_TABLET | Freq: Every day | ORAL | 3 refills | Status: DC

## 2017-05-26 NOTE — Patient Instructions (Addendum)
Do not take ibuprofen, aleve, goodys or aspirin. Start your Norvasc medication TODAY and do not miss doses. I will see you back in 1 week for BP recheck.  If at any point you develop HA, new shortness of breath, chest pain, or sudden vision changes, weakness in your hands and feet then go directly to the ED by calling 911.  I've refilled your albuterol inhaler and am starting you on a steroid inhaler.  I've refilled you albuterol nebs today.  We have given you a shot of steroids that is long acting to prevent a rebound of your asthma.      IF you received an x-ray today, you will receive an invoice from [facility]. Please contact [facility] at [phone] with questions or concerns regarding your invoice.   IF you received labwork today, you will receive an invoice from [facility]. Please contact [facility] at [phone] with questions or concerns regarding your invoice.   Our billing staff will not be able to assist you with questions regarding bills from these companies.  You will be contacted with the lab results as soon as they are available. The fastest way to get your results is to activate your My Chart account. Instructions are located on the last page of this paperwork. If you have not heard from [place] regarding the results in 2 weeks, please contact this office.

## 2017-05-26 NOTE — Progress Notes (Signed)
05/26/2017 11:38 AM   DOB: [birth year] / MRN: [id number]  SUBJECTIVE:  Kelly Meyers is a 41 y.o. female presenting for asthma recheck. Seen in ED for same 4 days ago and had a negative chest, d-dimer, CBC and CMP. Treated with IM steroids and nebs at that time. Tells me that her cough is not improving today. Is still taking prednisone today. She has a history of HTN and has been prescribed BP medication in the past. Tells me she has stopped taking this. Denies HA, chest pain, vision changes at this time. Is willing to come back for BP check next week after starting medications.   She is allergic to shellfish allergy; onion; and oxycodone.   She  has a past medical history of Allergy; Anemia; Asthma; and Hypertension.    She  reports that she has never smoked. She has never used smokeless tobacco. She reports that she drinks alcohol. She reports that she does not use drugs. She  reports that she currently engages in sexual activity. She reports using the following method of birth control/protection: IUD. The patient  has a past surgical history that includes Cesarean section.  Her family history includes Diabetes in her father and mother; Hypertension in her father; Multiple sclerosis in her mother.  Review of Systems  Constitutional: Negative for chills, diaphoresis and fever.  Respiratory: Positive for cough and wheezing. Negative for hemoptysis, sputum production and shortness of breath.   Cardiovascular: Negative for chest pain, orthopnea and leg swelling.  Gastrointestinal: Negative for nausea.  Skin: Negative for rash.  Neurological: Negative for dizziness, sensory change, focal weakness and headaches.    The problem list and medications were reviewed and updated by myself where necessary and exist elsewhere in the encounter.   OBJECTIVE:  BP (!) 176/112 (BP Location: Right Arm, Patient Position: Sitting, Cuff Size: Large)   Pulse 85   Temp 98.6 F (37 C) (Oral)   Resp 16    Ht 5\' 1"  (1.549 m)   Wt 218 lb (98.9 kg)   SpO2 99%   BMI 41.19 kg/m   BP Readings from Last 3 Encounters:  05/26/17 (!) 176/112  05/22/17 (!) 167/91  12/26/16 (!) 146/99   Wt Readings from Last 3 Encounters:  05/26/17 218 lb (98.9 kg)  12/26/16 215 lb (97.5 kg)  03/09/16 227 lb (103 kg)      Physical Exam  Constitutional: She is oriented to person, place, and time. She is active.  Non-toxic appearance.  Cardiovascular: Normal rate, regular rhythm, S1 normal, S2 normal, normal heart sounds and intact distal pulses.  Exam reveals no gallop, no friction rub and no decreased pulses.   No murmur heard. Pulmonary/Chest: Effort normal. No stridor. No tachypnea. No respiratory distress. She has wheezes (all lung fields). She has no rales. She exhibits no tenderness.  Abdominal: She exhibits no distension.  Musculoskeletal: She exhibits no edema or tenderness.  Neurological: She is alert and oriented to person, place, and time. She has normal reflexes. She displays normal reflexes. No cranial nerve deficit. Coordination normal.  Skin: Skin is warm and dry. She is not diaphoretic. No pallor.    No results found for this or any previous visit (from the past 72 hour(s)).  Dg Chest 2 View  Result Date: 05/26/2017 CLINICAL DATA:  41 year old female with an improving cough. EXAM: CHEST  2 VIEW COMPARISON:  05/22/2017 FINDINGS: The heart size and mediastinal contours are within normal limits. Both lungs are clear. The visualized skeletal  structures are unremarkable. IMPRESSION: No active cardiopulmonary disease. Electronically Signed   By: Sande Brothers M.D.   On: 05/26/2017 11:08    ASSESSMENT AND PLAN:  Marabeth was seen today for asthma and cough.  Diagnoses and all orders for this visit:  Cough: Will add on home nebs, a steroid inhaler, another shot of steroids here. See the 4th problem.  -     DG Chest 2 View; Future  History of asthma  Elevated blood pressure reading -      Recheck vitals  Essential hypertension: She has a history of this.  Will go ahead and start norvasc and see her back next week for BP recheck. She is asymptomatic in this regard and no signs of end organ damage today.   -     amLODipine (NORVASC) 5 MG tablet; Take 1 tablet (5 mg total) by mouth daily.  Needs flu shot -     Flu Vaccine QUAD 36+ mos IM  Wheezing -     albuterol (PROVENTIL) (2.5 MG/3ML) 0.083% nebulizer solution 2.5 mg; Take 3 mLs (2.5 mg total) by nebulization once. -     ipratropium (ATROVENT) nebulizer solution 0.5 mg; Take 2.5 mLs (0.5 mg total) by nebulization once. -     albuterol (PROVENTIL) (2.5 MG/3ML) 0.083% nebulizer solution; Take 3 mLs (2.5 mg total) by nebulization every 4 (four) hours as needed for wheezing or shortness of breath. -     methylPREDNISolone acetate (DEPO-MEDROL) injection 80 mg; Inject 1 mL (80 mg total) into the muscle once. -     fluticasone (FLOVENT HFA) 110 MCG/ACT inhaler; Inhale 2 puffs into the lungs every morning. -     albuterol (PROVENTIL HFA;VENTOLIN HFA) 108 (90 Base) MCG/ACT inhaler; Inhale 1-2 puffs into the lungs every 4 (four) hours as needed for wheezing or shortness of breath. Reported on 03/09/2016    The patient is advised to call or return to clinic if she does not see an improvement in symptoms, or to seek the care of the closest emergency department if she worsens with the above plan.   Deliah Boston, MHS, PA-C Primary Care at [facility] 05/26/2017 11:38 AM

## 2017-05-31 ENCOUNTER — Encounter: Payer: Self-pay | Admitting: Physician Assistant

## 2017-05-31 ENCOUNTER — Ambulatory Visit (INDEPENDENT_AMBULATORY_CARE_PROVIDER_SITE_OTHER): Payer: Self-pay | Admitting: Physician Assistant

## 2017-05-31 VITALS — BP 138/86 | HR 81 | Resp 16 | Ht 61.0 in | Wt 216.0 lb

## 2017-05-31 DIAGNOSIS — I10 Essential (primary) hypertension: Secondary | ICD-10-CM
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
## 2017-06-07 ENCOUNTER — Ambulatory Visit: Payer: Self-pay | Admitting: Physician Assistant

## 2017-11-20 ENCOUNTER — Ambulatory Visit: Payer: Self-pay | Admitting: Physician Assistant

## 2018-03-30 ENCOUNTER — Encounter (HOSPITAL_COMMUNITY): Payer: Self-pay | Admitting: Emergency Medicine

## 2018-03-30 ENCOUNTER — Ambulatory Visit (HOSPITAL_COMMUNITY)
Admission: EM | Admit: 2018-03-30 | Discharge: 2018-03-30 | Disposition: A | Discharge status: Home / Self Care | Payer: Managed Care, Other (non HMO) | Attending: Family Medicine | Admitting: Family Medicine

## 2018-03-30 DIAGNOSIS — S39012A Strain of muscle, fascia and tendon of lower back, initial encounter: Secondary | ICD-10-CM | POA: Diagnosis not present

## 2018-03-30 MED ORDER — NAPROXEN 500 MG PO TABS: 500.0000 mg | ORAL_TABLET | Freq: Two times a day (BID) | ORAL | 0 refills | Status: DC

## 2018-03-30 MED ORDER — CYCLOBENZAPRINE HCL 10 MG PO TABS: 10.0000 mg | ORAL_TABLET | Freq: Two times a day (BID) | ORAL | 0 refills | Status: DC | PRN

## 2018-03-30 NOTE — Discharge Instructions (Signed)
It was nice meeting you!!  We are prescribing naproxen for inflammation. You can take this 2 times a day with food.  We are also prescribing flexeril which is a muscle relaxer. Be aware that this medication may cause drowsiness. You may want to take this before bedtime.  You may also use heat or ice to the area for pain relief.  We also provided a list of back exercises that may be helpful.  If you develop worsening symptoms such as weakness, numbness or tingling, loss of bowel or bladder function please go to the ER.
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
## 2018-08-07 ENCOUNTER — Ambulatory Visit (INDEPENDENT_AMBULATORY_CARE_PROVIDER_SITE_OTHER): Payer: Managed Care, Other (non HMO) | Admitting: Family Medicine

## 2018-08-07 ENCOUNTER — Encounter: Payer: Self-pay | Admitting: Family Medicine

## 2018-08-07 VITALS — BP 149/92 | HR 101 | Temp 98.2°F | Resp 17 | Ht 61.0 in | Wt 221.2 lb

## 2018-08-07 DIAGNOSIS — L02234 Carbuncle of groin: Secondary | ICD-10-CM

## 2018-08-07 DIAGNOSIS — Z975 Presence of (intrauterine) contraceptive device: Secondary | ICD-10-CM | POA: Diagnosis not present

## 2018-08-07 DIAGNOSIS — Z7689 Persons encountering health services in other specified circumstances: Secondary | ICD-10-CM

## 2018-08-07 DIAGNOSIS — Z1329 Encounter for screening for other suspected endocrine disorder: Secondary | ICD-10-CM | POA: Diagnosis not present

## 2018-08-07 DIAGNOSIS — Z131 Encounter for screening for diabetes mellitus: Secondary | ICD-10-CM

## 2018-08-07 DIAGNOSIS — J4530 Mild persistent asthma, uncomplicated: Secondary | ICD-10-CM

## 2018-08-07 DIAGNOSIS — I10 Essential (primary) hypertension: Secondary | ICD-10-CM

## 2018-08-07 DIAGNOSIS — L0293 Carbuncle, unspecified: Secondary | ICD-10-CM

## 2018-08-07 MED ORDER — SULFAMETHOXAZOLE-TRIMETHOPRIM 800-160 MG PO TABS: 1.0000 | ORAL_TABLET | Freq: Two times a day (BID) | ORAL | 0 refills | Status: DC

## 2018-08-07 MED ORDER — ALBUTEROL SULFATE (2.5 MG/3ML) 0.083% IN NEBU: 2.5000 mg | INHALATION_SOLUTION | Freq: Four times a day (QID) | RESPIRATORY_TRACT | 12 refills | Status: DC | PRN

## 2018-08-07 MED ORDER — FLUTICASONE FUROATE 100 MCG/ACT IN AEPB: 2.0000 | INHALATION_SPRAY | Freq: Every day | RESPIRATORY_TRACT | 5 refills | Status: DC

## 2018-08-07 NOTE — Patient Instructions (Addendum)
Thank you for choosing Primary Care at [facility] to be your medical home!    Kelly Meyers was seen by Joaquin Courts, FNP today.   Estill Cotta Mcclenney's primary care provider is Bing Neighbors, FNP.   For the best care possible, you should try to see Joaquin Courts, FNP-C whenever you come to the clinic.   We look forward to seeing you again soon!  If you have any questions about your visit today, please call us at [phone] or feel free to reach your primary care provider via MyChart.        Hypertension Hypertension is another name for high blood pressure. High blood pressure forces your heart to work harder to pump blood. This can cause problems over time. There are two numbers in a blood pressure reading. There is a top number (systolic) over a bottom number (diastolic). It is best to have a blood pressure below 120/80. Healthy choices can help lower your blood pressure. You may need medicine to help lower your blood pressure if:  Your blood pressure cannot be lowered with healthy choices.  Your blood pressure is higher than 130/80.  Follow these instructions at home: Eating and drinking  If directed, follow the DASH eating plan. This diet includes: ? Filling half of your plate at each meal with fruits and vegetables. ? Filling one quarter of your plate at each meal with whole grains. Whole grains include whole wheat pasta, brown rice, and whole grain bread. ? Eating or drinking low-fat dairy products, such as skim milk or low-fat yogurt. ? Filling one quarter of your plate at each meal with low-fat (lean) proteins. Low-fat proteins include fish, skinless chicken, eggs, beans, and tofu. ? Avoiding fatty meat, cured and processed meat, or chicken with skin. ? Avoiding premade or processed food.  Eat less than 1,500 mg of salt (sodium) a day.  Limit alcohol use to no more than 1 drink a day for nonpregnant women and 2 drinks a day for men. One drink equals  12 oz of beer, 5 oz of wine, or 1 oz of hard liquor. Lifestyle  Work with your doctor to stay at a healthy weight or to lose weight. Ask your doctor what the best weight is for you.  Get at least 30 minutes of exercise that causes your heart to beat faster (aerobic exercise) most days of the week. This may include walking, swimming, or biking.  Get at least 30 minutes of exercise that strengthens your muscles (resistance exercise) at least 3 days a week. This may include lifting weights or pilates.  Do not use any products that contain nicotine or tobacco. This includes cigarettes and e-cigarettes. If you need help quitting, ask your doctor.  Check your blood pressure at home as told by your doctor.  Keep all follow-up visits as told by your doctor. This is important. Medicines  Take over-the-counter and prescription medicines only as told by your doctor. Follow directions carefully.  Do not skip doses of blood pressure medicine. The medicine does not work as well if you skip doses. Skipping doses also puts you at risk for problems.  Ask your doctor about side effects or reactions to medicines that you should watch for. Contact a doctor if:  You think you are having a reaction to the medicine you are taking.  You have headaches that keep coming back (recurring).  You feel dizzy.  You have swelling in your ankles.  You have trouble with your vision.  Get help right away if:  You get a very bad headache.  You start to feel confused.  You feel weak or numb.  You feel faint.  You get very bad pain in your: ? Chest. ? Belly (abdomen).  You throw up (vomit) more than once.  You have trouble breathing. Summary  Hypertension is another name for high blood pressure.  Making healthy choices can help lower blood pressure. If your blood pressure cannot be controlled with healthy choices, you may need to take medicine. This information is not intended to replace advice given  to you by your health care provider. Make sure you discuss any questions you have with your health care provider. Document Released: 03/07/2008 Document Revised: 08/17/2016 Document Reviewed: 08/17/2016 Elsevier Interactive Patient Education  2018 [employer] Inc[employer]      Hidradenitis Suppurativa Hidradenitis suppurativa is a long-term (chronic) skin disease that starts with blocked sweat glands or hair follicles. Bacteria may grow in these blocked openings of your skin. Hidradenitis suppurativa is like a severe form of acne that develops in areas of your body where acne would be unusual. It is most likely to affect the areas of your body where skin rubs against skin and becomes moist. This includes your:  Underarms.  Groin.  Genital areas.  Buttocks.  Upper thighs.  Breasts.  Hidradenitis suppurativa may start out with small pimples. The pimples can develop into deep sores that break open (rupture) and drain pus. Over time your skin may thicken and become scarred. Hidradenitis suppurativa cannot be passed from person to person. What are the causes? The exact cause of hidradenitis suppurativa is not known. This condition may be due to:  Female and female hormones. The condition is rare before and after puberty.  An overactive body defense system (immune system). Your immune system may overreact to the blocked hair follicles or sweat glands and cause swelling and pus-filled sores.  What increases the risk? You may have a higher risk of hidradenitis suppurativa if you:  Are a woman.  Are between ages 80 and 4.  Have a family history of hidradenitis suppurativa.  Have a personal history of acne.  Are overweight.  Smoke.  Take the drug lithium.  What are the signs or symptoms? The first signs of an outbreak are usually painful skin bumps that look like pimples. As the condition progresses:  Skin bumps may get bigger and grow deeper into the skin.  Bumps under the skin may  rupture and drain smelly pus.  Skin may become itchy and infected.  Skin may thicken and scar.  Drainage may continue through tunnels under the skin (fistulas).  Walking and moving your arms can become painful.  How is this diagnosed? Your health care provider may diagnose hidradenitis suppurativa based on your medical history and your signs and symptoms. A physical exam will also be done. You may need to see a health care provider who specializes in skin diseases (dermatologist). You may also have tests done to confirm the diagnosis. These can include:  Swabbing a sample of pus or drainage from your skin so it can be sent to the lab and tested for infection.  Blood tests to check for infection.  How is this treated? The same treatment will not work for everybody with hidradenitis suppurativa. Your treatment will depend on how severe your symptoms are. You may need to try several treatments to find what works best for you. Part of your treatment may include cleaning and bandaging (dressing) your  wounds. You may also have to take medicines, such as the following:  Antibiotics.  Acne medicines.  Medicines to block or suppress the immune system.  A diabetes medicine (metformin) is sometimes used to treat this condition.  For women, birth control pills can sometimes help relieve symptoms.  You may need surgery if you have a severe case of hidradenitis suppurativa that does not respond to medicine. Surgery may involve:  Using a laser to clear the skin and remove hair follicles.  Opening and draining deep sores.  Removing the areas of skin that are diseased and scarred.  Follow these instructions at home:  Learn as much as you can about your disease, and work closely with your health care providers.  Take medicines only as directed by your health care provider.  If you were prescribed an antibiotic medicine, finish it all even if you start to feel better.  If you are  overweight, losing weight may be very helpful. Try to reach and maintain a healthy weight.  Do not use any tobacco products, including cigarettes, chewing tobacco, or electronic cigarettes. If you need help quitting, ask your health care provider.  Do not shave the areas where you get hidradenitis suppurativa.  Do not wear deodorant.  Wear loose-fitting clothes.  Try not to overheat and get sweaty.  Take a daily bleach bath as directed by your health care provider. ? Fill your bathtub halfway with water. ? Pour in  cup of unscented household bleach. ? Soak for 5-10 minutes.  Cover sore areas with a warm, clean washcloth (compress) for 5-10 minutes. Contact a health care provider if:  You have a flare-up of hidradenitis suppurativa.  You have chills or a fever.  You are having trouble controlling your symptoms at home. This information is not intended to replace advice given to you by your health care provider. Make sure you discuss any questions you have with your health care provider. Document Released: 05/03/2004 Document Revised: 02/25/2016 Document Reviewed: 12/20/2013 Elsevier Interactive Patient Education  2018 [employer].

## 2018-08-07 NOTE — Progress Notes (Signed)
Kelly Meyers, is a 42 y.o. female  ZOX:[id number]  [id number]  DOB - 1976-06-09  CC:  Chief Complaint  Patient presents with  . Establish Care  . Asthma    states that she has been having to use her inhaler & nebulizer solution more often recently  . Hypertension    checks BP occasionally. thinks she needs to restart BP meds(amlodipine 5 mg). has been off for almost 2 months.       HPI: Kelly Meyers is a 42 y.o. female is here today to establish care.   Kelly Meyers has ALLERGIC RHINITIS and ASTHMA on their problem list.    Today's visit:   Hypertension History of hypertension x 1 year ago. Placed on amlodipine 5 mg and discontinue as she desired to use lifestyle measures to control BP. Checks BP occasionally at home and readings have started increasing.  Hesitant to resume amlodipine 5 mg. Patient denies new headaches, nausea, new weakness , numbness or tingling, SOB, edema, or worrisome cough.    Moderate Asthma  Patient has a history of moderate chronic asthma.  She reports that it worsens with season changes.  She has not had a recent PCP however reports in the past that she has been on Flovent and Proventil.  She is been out of Flovent for some time now.  Reports increased use of her albuterol which concerned her that her asthma is flaring up.  She reports some wheezing and shortness of breath when symptoms are at their worst.  She denies any current URI symptoms.  She does not chronically take antihistamines.  Denies chest tightness or productive cough.  Skin infection History of recurrent boils. Previously cultured and was positive for MRSA. She currently has 2 boils forming in her inner left groin thigh area.  Previously treated with antibiotics uncertain of his Bactrim.  She denies fever, chills, nausea, vomiting.  Patient request a referral to OB/GYN to have her IUD removed.  She reports as of last month IUD has been present for 5 years.  She would like to have  the IUD removed and replaced.  Current medications: Current Outpatient Medications:  .  albuterol (ACCUNEB) 1.25 MG/3ML nebulizer solution, Take 1 ampule by nebulization every 6 (six) hours as needed for wheezing or shortness of breath., Disp: , Rfl:  .  fluticasone (FLOVENT HFA) 110 MCG/ACT inhaler, Inhale 2 puffs into the lungs every morning., Disp: 1 Inhaler, Rfl: 12   Pertinent family medical history: family history includes Diabetes in her father and mother; Hypertension in her father; Multiple sclerosis in her mother.   Allergies  Allergen Reactions  . Shellfish Allergy Anaphylaxis and Itching  . Onion Itching  . Oxycodone Itching    Social History   Socioeconomic History  . Marital status: Single    Spouse name: Not on file  . Number of children: Not on file  . Years of education: Not on file  . Highest education level: Not on file  Occupational History  . Not on file  Social Needs  . Financial resource strain: Not on file  . Food insecurity:    Worry: Not on file    Inability: Not on file  . Transportation needs:    Medical: Not on file    Non-medical: Not on file  Tobacco Use  . Smoking status: Never Smoker  . Smokeless tobacco: Never Used  Substance and Sexual Activity  . Alcohol use: Yes  . Drug use: No  . Sexual activity: Yes  Birth control/protection: IUD  Lifestyle  . Physical activity:    Days per week: Not on file    Minutes per session: Not on file  . Stress: Not on file  Relationships  . Social connections:    Talks on phone: Not on file    Gets together: Not on file    Attends religious service: Not on file    Active member of club or organization: Not on file    Attends meetings of clubs or organizations: Not on file    Relationship status: Not on file  . Intimate partner violence:    Fear of current or ex partner: Not on file    Emotionally abused: Not on file    Physically abused: Not on file    Forced sexual activity: Not on file   Other Topics Concern  . Not on file  Social History Narrative  . Not on file    Review of Systems: Constitutional: Negative for fever, chills, diaphoresis, activity change, appetite change and fatigue. HENT: Negative for ear pain, nosebleeds, congestion, facial swelling, rhinorrhea, neck pain, neck stiffness and ear discharge.  Respiratory: Intermittent wheezing and intermittent shortness of breath Cardiovascular: Negative for chest pain, palpitations and leg swelling. Gastrointestinal: Negative for abdominal distention. Musculoskeletal: Negative for back pain, joint swelling, arthralgia and gait problem. Neurological: Negative for dizziness, tremors, seizures, syncope, facial asymmetry, speech difficulty, weakness, light-headedness, numbness and headaches.  Psychiatric/Behavioral: Negative for hallucinations, behavioral problems, confusion, dysphoric mood, decreased concentration and agitation.  Objective:   Vitals:   08/07/18 1322  BP: (!) 149/92  Pulse: (!) 101  Resp: 17  Temp: 98.2 F (36.8 C)  SpO2: 96%    BP Readings from Last 3 Encounters:  08/07/18 (!) 149/92  03/30/18 (!) 136/92  05/31/17 138/86    Filed Weights   08/07/18 1322  Weight: 221 lb 3.2 oz (100.3 kg)      Physical Exam: Constitutional: Patient appears well-developed and well-nourished. No distress. HENT: Normocephalic, atraumatic, External right and left ear normal. Oropharynx is clear and moist.  Eyes: Conjunctivae and EOM are normal. PERRLA, no scleral icterus. Neck: Normal ROM. Neck supple. No JVD. No tracheal deviation. No thyromegaly. CVS: RRR, S1/S2 +, no murmurs, no gallops, no carotid bruit.  Pulmonary: Effort and breath sounds normal, no stridor, rhonchi,  Mild expiratory wheeze noted on exam, posterior upper lung fields bilaterally. Abdominal: Soft. BS +, no distension, tenderness, rebound or guarding.  Musculoskeletal: Normal range of motion. No edema and no tenderness.  Neuro: Alert.  Normal muscle tone coordination. Normal gait. BUE and BLE strength 5/5. Bilateral hand grips symmetrical.No cranial nerve deficit. Skin: Skin is warm and dry. No rash noted. Not diaphoretic. No erythema. No pallor. Psychiatric: Normal mood and affect. Behavior, judgment, thought content normal.      Assessment and plan:  1. Encounter to establish care  2. Screening for diabetes mellitus - Hemoglobin A1c - Comprehensive metabolic panel  3. Screening for thyroid disorder - Thyroid Panel With TSH  4. Recurrent boils, suspect the underlying cause is  Hidradenitis Suppurativa - Ambulatory referral to Dermatology -Start Bactrim x10 days for existing boils.  If infection does not resolve please follow-up here in the office.  5. Mild persistent asthma without complication -Patient's asthma typically well controlled during the season however exacerbates with the change of season.  We will continue her on [place] therapy along with neb treatments as needed.  Will trial Arnuity Ellipta for maintenance and prevention of asthma exacerbations.  6.  IUD (intrauterine device) in place, removal and replacement. - Ambulatory referral to Gynecology  7. Essential hypertension, uncontrolled. Will start on amlodipine 5 mg once daily if blood pressure remains elevated on recheck in 2 weeks.  Patient would like to hold off for another 2 weeks before starting medication to see if pressure goes down. -Checking urinalysis to evaluate for protein.   A total of 40 minutes spent, greater than 50 % of this time was spent counseling and coordination of care.  Return in about 2 weeks (around 08/21/2018), or lipid panel fasting and BP check, for schedule 3 month follow-up BP and Asthma.   The patient was given clear instructions to go to ER or return to medical center if symptoms don't improve, worsen or new problems develop. The patient verbalized understanding. The patient was advised  to call and obtain lab results  if they haven't heard anything from out office within 7-10 business days.  Joaquin Courts, FNP Primary Care at [facility] [address], [place] [address] [phone]: [phone]    This note has been created with Dragon speech recognition software and Paediatric nurse. Any transcriptional errors are unintentional.

## 2018-08-08 LAB — HEMOGLOBIN A1C
Est. average glucose Bld gHb Est-mCnc: 126 mg/dL
Hgb A1c MFr Bld: 6 % — ABNORMAL HIGH (ref 4.8–5.6)

## 2018-08-08 LAB — COMPREHENSIVE METABOLIC PANEL
ALBUMIN: 4 g/dL (ref 3.5–5.5)
ALT: 13 IU/L (ref 0–32)
AST: 15 IU/L (ref 0–40)
Albumin/Globulin Ratio: 2.1 (ref 1.2–2.2)
Alkaline Phosphatase: 49 IU/L (ref 39–117)
BUN / CREAT RATIO: 11 (ref 9–23)
BUN: 8 mg/dL (ref 6–24)
Bilirubin Total: 0.4 mg/dL (ref 0.0–1.2)
CO2: 21 mmol/L (ref 20–29)
Calcium: 9 mg/dL (ref 8.7–10.2)
Chloride: 104 mmol/L (ref 96–106)
Creatinine, Ser: 0.71 mg/dL (ref 0.57–1.00)
GFR calc non Af Amer: 107 mL/min/{1.73_m2} (ref >59)
GFR, EST AFRICAN AMERICAN: 123 mL/min/{1.73_m2} (ref >59)
GLUCOSE: 85 mg/dL (ref 65–99)
Globulin, Total: 1.9 g/dL (ref 1.5–4.5)
Potassium: 4 mmol/L (ref 3.5–5.2)
Sodium: 142 mmol/L (ref 134–144)
TOTAL PROTEIN: 5.9 g/dL — AB (ref 6.0–8.5)

## 2018-08-08 LAB — URINALYSIS
Bilirubin, UA: NEGATIVE
Glucose, UA: NEGATIVE
Ketones, UA: NEGATIVE
LEUKOCYTES UA: NEGATIVE
Nitrite, UA: NEGATIVE
PH UA: 8 — AB (ref 5.0–7.5)
PROTEIN UA: NEGATIVE
RBC, UA: NEGATIVE
Specific Gravity, UA: 1.018 (ref 1.005–1.030)
Urobilinogen, Ur: 1 mg/dL (ref 0.2–1.0)

## 2018-08-08 LAB — CBC WITH DIFFERENTIAL/PLATELET
BASOS ABS: 0.1 10*3/uL (ref 0.0–0.2)
Basos: 1 %
EOS (ABSOLUTE): 0.3 10*3/uL (ref 0.0–0.4)
Eos: 3 %
HEMOGLOBIN: 13.1 g/dL (ref 11.1–15.9)
Hematocrit: 39.6 % (ref 34.0–46.6)
IMMATURE GRANS (ABS): 0 10*3/uL (ref 0.0–0.1)
IMMATURE GRANULOCYTES: 0 %
LYMPHS: 33 %
Lymphocytes Absolute: 3.5 10*3/uL — ABNORMAL HIGH (ref 0.7–3.1)
MCH: 28.7 pg (ref 26.6–33.0)
MCHC: 33.1 g/dL (ref 31.5–35.7)
MCV: 87 fL (ref 79–97)
MONOCYTES: 7 %
Monocytes Absolute: 0.7 10*3/uL (ref 0.1–0.9)
NEUTROS PCT: 56 %
Neutrophils Absolute: 6.1 10*3/uL (ref 1.4–7.0)
Platelets: 259 10*3/uL (ref 150–450)
RBC: 4.57 x10E6/uL (ref 3.77–5.28)
RDW: 13.6 % (ref 12.3–15.4)
WBC: 10.8 10*3/uL (ref 3.4–10.8)

## 2018-08-08 LAB — THYROID PANEL WITH TSH
FREE THYROXINE INDEX: 1.7 (ref 1.2–4.9)
T3 Uptake Ratio: 27 % (ref 24–39)
T4, Total: 6.2 ug/dL (ref 4.5–12.0)
TSH: 0.748 u[IU]/mL (ref 0.450–4.500)

## 2018-08-09 ENCOUNTER — Telehealth: Payer: Self-pay | Admitting: Family Medicine

## 2018-08-09 DIAGNOSIS — L0293 Carbuncle, unspecified: Secondary | ICD-10-CM | POA: Insufficient documentation

## 2018-08-09 NOTE — Telephone Encounter (Signed)
Left voice mail to call back 

## 2018-08-09 NOTE — Telephone Encounter (Signed)
Patient called requesting lab results, patient is returning a call from nurse, please follow up.

## 2018-08-20 ENCOUNTER — Ambulatory Visit: Payer: Managed Care, Other (non HMO) | Admitting: Family Medicine

## 2018-08-20 NOTE — Progress Notes (Signed)
Patient notified of results & recommendations. Expressed understanding.

## 2018-09-03 ENCOUNTER — Ambulatory Visit: Payer: Self-pay | Admitting: Obstetrics & Gynecology

## 2018-11-05 ENCOUNTER — Ambulatory Visit: Payer: Managed Care, Other (non HMO) | Admitting: Family Medicine

## 2019-05-15 ENCOUNTER — Ambulatory Visit (HOSPITAL_COMMUNITY)
Admission: EM | Admit: 2019-05-15 | Discharge: 2019-05-15 | Disposition: A | Discharge status: Home / Self Care | Payer: BC Managed Care – PPO | Attending: Urgent Care | Admitting: Urgent Care

## 2019-05-15 ENCOUNTER — Other Ambulatory Visit: Payer: Self-pay

## 2019-05-15 ENCOUNTER — Encounter (HOSPITAL_COMMUNITY): Payer: Self-pay | Admitting: Emergency Medicine

## 2019-05-15 DIAGNOSIS — J4541 Moderate persistent asthma with (acute) exacerbation: Secondary | ICD-10-CM | POA: Diagnosis not present

## 2019-05-15 DIAGNOSIS — Z79899 Other long term (current) drug therapy: Secondary | ICD-10-CM | POA: Diagnosis not present

## 2019-05-15 DIAGNOSIS — Z885 Allergy status to narcotic agent status: Secondary | ICD-10-CM | POA: Insufficient documentation

## 2019-05-15 DIAGNOSIS — Z20828 Contact with and (suspected) exposure to other viral communicable diseases: Secondary | ICD-10-CM | POA: Diagnosis not present

## 2019-05-15 DIAGNOSIS — R062 Wheezing: Secondary | ICD-10-CM

## 2019-05-15 DIAGNOSIS — E669 Obesity, unspecified: Secondary | ICD-10-CM

## 2019-05-15 DIAGNOSIS — J3089 Other allergic rhinitis: Secondary | ICD-10-CM | POA: Insufficient documentation

## 2019-05-15 DIAGNOSIS — Z91013 Allergy to seafood: Secondary | ICD-10-CM | POA: Diagnosis not present

## 2019-05-15 DIAGNOSIS — I10 Essential (primary) hypertension: Secondary | ICD-10-CM | POA: Insufficient documentation

## 2019-05-15 DIAGNOSIS — R059 Cough, unspecified: Secondary | ICD-10-CM

## 2019-05-15 DIAGNOSIS — R030 Elevated blood-pressure reading, without diagnosis of hypertension: Secondary | ICD-10-CM

## 2019-05-15 DIAGNOSIS — R05 Cough: Secondary | ICD-10-CM | POA: Insufficient documentation

## 2019-05-15 MED ORDER — ARNUITY ELLIPTA 100 MCG/ACT IN AEPB: 2.0000 | INHALATION_SPRAY | Freq: Every day | RESPIRATORY_TRACT | 0 refills | Status: DC

## 2019-05-15 MED ORDER — PREDNISONE 20 MG PO TABS: ORAL_TABLET | ORAL | 0 refills | Status: DC

## 2019-05-15 MED ORDER — ALBUTEROL SULFATE (2.5 MG/3ML) 0.083% IN NEBU: 2.5000 mg | INHALATION_SOLUTION | Freq: Four times a day (QID) | RESPIRATORY_TRACT | 0 refills | Status: DC | PRN

## 2019-05-15 NOTE — ED Triage Notes (Signed)
Tight chest , back pain, wheezing. Patient has used inhalers and breathing machine at home and no improvement.  Symptoms started yesterday morning.  Initially, nose was stuffy, then chest tightened

## 2019-05-15 NOTE — ED Provider Notes (Signed)
MRN: [id number] DOB: 01/16/1976  Subjective:   Kelly Meyers is a 43 y.o. female presenting for 1 to 2-day history acute onset of shortness of breath, wheezing, fatigue, runny and stuffy nose.  Patient has been using albuterol 2 times a day, takes her Arnuity Ellipta twice daily.  She has not been taking her blood pressure medication because she has felt ill.  Takes 5 mg once daily.  She has isolated and self quarantined at home throughout most of COVID-19.  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  No current facility-administered medications for this encounter.   Current Outpatient Medications:  .  amLODipine (NORVASC) 5 MG tablet, Take 5 mg by mouth daily., Disp: , Rfl:  .  albuterol (PROVENTIL) (2.5 MG/3ML) 0.083% nebulizer solution, Take 3 mLs (2.5 mg total) by nebulization every 6 (six) hours as needed for wheezing or shortness of breath., Disp: 75 mL, Rfl: 0 .  Fluticasone Furoate (ARNUITY ELLIPTA) 100 MCG/ACT AEPB, Inhale 2 puffs into the lungs daily., Disp: 30 each, Rfl: 0 .  predniSONE (DELTASONE) 20 MG tablet, Take 2 tablets daily with breakfast., Disp: 10 tablet, Rfl: 0    Allergies  Allergen Reactions  . Shellfish Allergy Anaphylaxis and Itching  . Onion Itching  . Oxycodone Itching    Past Medical History:  Diagnosis Date  . Allergy   . Anemia   . Asthma   . Hypertension      Past Surgical History:  Procedure Laterality Date  . CESAREAN SECTION      Review of Systems  Constitutional: Positive for malaise/fatigue. Negative for fever.  HENT: Positive for congestion. Negative for ear pain, sinus pain and sore throat.   Eyes: Negative for blurred vision, double vision, discharge and redness.  Respiratory: Positive for cough, shortness of breath and wheezing. Negative for hemoptysis.   Cardiovascular: Negative for chest pain.       Chest tightness.  Gastrointestinal: Negative for abdominal pain, diarrhea, nausea and vomiting.  Genitourinary:  Negative for dysuria, flank pain and hematuria.  Musculoskeletal: Negative for myalgias.  Skin: Negative for rash.  Neurological: Negative for dizziness, weakness and headaches.  Psychiatric/Behavioral: Negative for depression and substance abuse. Hallucinations:     Objective:   Vitals: BP (!) 203/114 (BP Location: Right Arm)   Pulse 74   Temp 98.3 F (36.8 C) (Oral)   Resp (!) 22   SpO2 99%   Physical Exam Constitutional:      General: She is not in acute distress.    Appearance: Normal appearance. She is well-developed. She is obese. She is ill-appearing. She is not toxic-appearing or diaphoretic.  HENT:     Head: Normocephalic and atraumatic.     Right Ear: Tympanic membrane and ear canal normal. No drainage or tenderness. No middle ear effusion. Tympanic membrane is not erythematous.     Left Ear: Tympanic membrane and ear canal normal. No drainage or tenderness.  No middle ear effusion. Tympanic membrane is not erythematous.     Nose: Nose normal. No congestion or rhinorrhea.     Mouth/Throat:     Mouth: Mucous membranes are moist. No oral lesions.     Pharynx: Oropharynx is clear. No pharyngeal swelling, oropharyngeal exudate, posterior oropharyngeal erythema or uvula swelling.     Tonsils: No tonsillar exudate or tonsillar abscesses.  Eyes:     Extraocular Movements: Extraocular movements intact.     Right eye: Normal extraocular motion.     Left eye: Normal extraocular motion.  Conjunctiva/sclera: Conjunctivae normal.     Pupils: Pupils are equal, round, and reactive to light.  Neck:     Musculoskeletal: Normal range of motion and neck supple.  Cardiovascular:     Rate and Rhythm: Normal rate and regular rhythm.     Pulses: Normal pulses.     Heart sounds: Normal heart sounds. No murmur. No friction rub. No gallop.   Pulmonary:     Effort: Pulmonary effort is normal. No respiratory distress.     Breath sounds: No stridor. Wheezing (Mid to lower lung fields  bilaterally) present. No rhonchi or rales.  Lymphadenopathy:     Cervical: No cervical adenopathy.  Skin:    General: Skin is warm and dry.     Findings: No rash.  Neurological:     General: No focal deficit present.     Mental Status: She is alert and oriented to person, place, and time.  Psychiatric:        Mood and Affect: Mood normal.        Behavior: Behavior normal.        Thought Content: Thought content normal.     Assessment and Plan :   1. Wheezing   2. Cough   3. Moderate persistent asthma with exacerbation   4. Allergic rhinitis due to other allergic trigger, unspecified seasonality     Due to severely elevated blood pressure, will hold off on using steroid course until tomorrow.  Patient's lung exam is not emergency room caliber and pulse oximetry is reassuring.  Counseled patient that she needs to increase her amlodipine to twice daily.  I refilled her Arnuity Ellipta and albuterol.  Patient is to keep close follow-up with her PCP. Counseled patient on potential for adverse effects with medications prescribed/recommended today, ER and return-to-clinic precautions discussed, patient verbalized understanding.    Wallis BambergMani, Leveon Pelzer, [place]PA-C 05/15/19 1942

## 2019-05-17 LAB — NOVEL CORONAVIRUS, NAA (HOSP ORDER, SEND-OUT TO REF LAB; TAT 18-24 HRS): SARS-CoV-2, NAA: NOT DETECTED

## 2019-08-23 ENCOUNTER — Encounter (HOSPITAL_COMMUNITY): Payer: Self-pay

## 2019-08-23 ENCOUNTER — Ambulatory Visit (HOSPITAL_COMMUNITY)
Admission: EM | Admit: 2019-08-23 | Discharge: 2019-08-23 | Disposition: A | Discharge status: Home / Self Care | Payer: BC Managed Care – PPO | Attending: Emergency Medicine | Admitting: Emergency Medicine

## 2019-08-23 ENCOUNTER — Other Ambulatory Visit: Payer: Self-pay

## 2019-08-23 DIAGNOSIS — Z7952 Long term (current) use of systemic steroids: Secondary | ICD-10-CM | POA: Diagnosis not present

## 2019-08-23 DIAGNOSIS — Z91013 Allergy to seafood: Secondary | ICD-10-CM | POA: Diagnosis not present

## 2019-08-23 DIAGNOSIS — I10 Essential (primary) hypertension: Secondary | ICD-10-CM | POA: Diagnosis not present

## 2019-08-23 DIAGNOSIS — R0602 Shortness of breath: Secondary | ICD-10-CM

## 2019-08-23 DIAGNOSIS — Z885 Allergy status to narcotic agent status: Secondary | ICD-10-CM | POA: Diagnosis not present

## 2019-08-23 DIAGNOSIS — Z91018 Allergy to other foods: Secondary | ICD-10-CM | POA: Insufficient documentation

## 2019-08-23 DIAGNOSIS — Z79899 Other long term (current) drug therapy: Secondary | ICD-10-CM | POA: Diagnosis not present

## 2019-08-23 DIAGNOSIS — Z20828 Contact with and (suspected) exposure to other viral communicable diseases: Secondary | ICD-10-CM | POA: Insufficient documentation

## 2019-08-23 DIAGNOSIS — J4521 Mild intermittent asthma with (acute) exacerbation: Secondary | ICD-10-CM | POA: Diagnosis not present

## 2019-08-23 DIAGNOSIS — Z8249 Family history of ischemic heart disease and other diseases of the circulatory system: Secondary | ICD-10-CM | POA: Diagnosis not present

## 2019-08-23 DIAGNOSIS — Z833 Family history of diabetes mellitus: Secondary | ICD-10-CM | POA: Insufficient documentation

## 2019-08-23 MED ORDER — ALBUTEROL SULFATE HFA 108 (90 BASE) MCG/ACT IN AERS: 1.0000 | INHALATION_SPRAY | Freq: Four times a day (QID) | RESPIRATORY_TRACT | 0 refills | Status: DC | PRN

## 2019-08-23 MED ORDER — PREDNISONE 20 MG PO TABS: 40.0000 mg | ORAL_TABLET | Freq: Every day | ORAL | 0 refills | Status: AC

## 2019-08-23 MED ORDER — ARNUITY ELLIPTA 100 MCG/ACT IN AEPB: 2.0000 | INHALATION_SPRAY | Freq: Every day | RESPIRATORY_TRACT | 0 refills | Status: DC

## 2019-08-23 NOTE — ED Triage Notes (Signed)
Pt presents with asthma flare up since last night; pt states she used the last of inhaler last night and gave herself 2 breathing treatments and still feels chest tightness & shortness of breath.

## 2019-08-23 NOTE — Discharge Instructions (Signed)
Push fluids to ensure adequate hydration and keep secretions thin.  I have refilled your daily inhaler.  Albuterol inhaler or nebulizer every 6 hours as needed for wheezing or shortness of breath .  5 days of prednisone.  Over the counter medications as needed for symptoms.  Self isolate until covid results are back and negative.  Will notify you by phone of any positive findings. Your negative results will be sent through your MyChart.     I do recommend establishing with a primary care provider to manage asthma longer term.

## 2019-08-23 NOTE — ED Provider Notes (Signed)
[facility]    CSN: [id number] Arrival date & time: 08/23/19  1542      History   Chief Complaint Chief Complaint  Patient presents with  . Asthma    HPI Kelly Meyers is a 43 y.o. female.   Kelly Meyers presents with complaints of chest tightness which causes shortness of breath , fatigue. Started yesterday. Wheezing, worse at night. Cough is occasionally productive. No fever. No gi symptoms. No loss of taste or smell. No runny nose, maybe more congestion. Similar to other asthma flares. Ran out of inhaler yesterday, it had helped some yesterday. Breathing treatment earlier today did help some. No known ill contacts. Works from home.     ROS per HPI, negative if not otherwise mentioned.      Past Medical History:  Diagnosis Date  . Allergy   . Anemia   . Asthma   . Hypertension     Patient Active Problem List   Diagnosis Date Noted  . Recurrent boils 08/09/2018  . ALLERGIC RHINITIS 12/26/2007  . ASTHMA 12/26/2007    Past Surgical History:  Procedure Laterality Date  . CESAREAN SECTION      OB History   No obstetric history on file.      Home Medications    Prior to Admission medications   Medication Sig Start Date End Date Taking? Authorizing Provider  albuterol (PROAIR HFA) 108 (90 Base) MCG/ACT inhaler Inhale 1-2 puffs into the lungs every 6 (six) hours as needed for wheezing or shortness of breath. 08/23/19   Georgetta Haber, NP  albuterol (PROVENTIL) (2.5 MG/3ML) 0.083% nebulizer solution Take 3 mLs (2.5 mg total) by nebulization every 6 (six) hours as needed for wheezing or shortness of breath. 05/15/19   Wallis Bamberg, PA-C  amLODipine (NORVASC) 5 MG tablet Take 5 mg by mouth daily.    [provider]  Fluticasone Furoate (ARNUITY ELLIPTA) 100 MCG/ACT AEPB Inhale 2 puffs into the lungs daily. 08/23/19   Georgetta Haber, NP  predniSONE (DELTASONE) 20 MG tablet Take 2 tablets (40 mg total) by mouth daily with  breakfast for 5 days. 08/23/19 08/28/19  Georgetta Haber, NP    Family History Family History  Problem Relation Age of Onset  . Diabetes Mother   . Multiple sclerosis Mother   . Hypertension Father   . Diabetes Father     Social History Social History   Tobacco Use  . Smoking status: Never Smoker  . Smokeless tobacco: Never Used  Substance Use Topics  . Alcohol use: Yes  . Drug use: No     Allergies   Shellfish allergy, Onion, and Oxycodone   Review of Systems Review of Systems   Physical Exam Triage Vital Signs ED Triage Vitals  Enc Vitals Group     BP 08/23/19 1637 (!) 123/92     Pulse Rate 08/23/19 1637 75     Resp 08/23/19 1637 16     Temp 08/23/19 1637 98.2 F (36.8 C)     Temp Source 08/23/19 1637 Oral     SpO2 08/23/19 1637 100 %     Weight --      Height --      Head Circumference --      Peak Flow --      Pain Score 08/23/19 1639 4     Pain Loc --      Pain Edu? --      Excl. in GC? --  No data found.  Updated Vital Signs BP (!) 123/92 (BP Location: Right Arm)   Pulse 75   Temp 98.2 F (36.8 C) (Oral)   Resp 16   SpO2 100%   Visual Acuity Right Eye Distance:   Left Eye Distance:   Bilateral Distance:    Right Eye Near:   Left Eye Near:    Bilateral Near:     Physical Exam Constitutional:      General: She is not in acute distress.    Appearance: She is well-developed.  Cardiovascular:     Rate and Rhythm: Normal rate and regular rhythm.     Heart sounds: Normal heart sounds.  Pulmonary:     Effort: Pulmonary effort is normal.     Breath sounds: Normal breath sounds.     Comments: No active wheezing at this time  Skin:    General: Skin is warm and dry.  Neurological:     Mental Status: She is alert and oriented to person, place, and time.      UC Treatments / Results  Labs (all labs ordered are listed, but only abnormal results are displayed) Labs Reviewed  NOVEL CORONAVIRUS, NAA (HOSPITAL ORDER, SEND-OUT TO REF  LAB)    EKG   Radiology No results found.  Procedures Procedures (including critical care time)  Medications Ordered in UC Medications - No data to display  Initial Impression / Assessment and Plan / UC Course  I have reviewed the triage vital signs and the nursing notes.  Pertinent labs & imaging results that were available during my care of the patient were reviewed by me and considered in my medical decision making (see chart for details).     Non toxic. Benign physical exam. Lungs clear at this time. Afebrile. No increased work of breathing. Inhalers refilled. Course of prednisone provided. covid testing collected and pending as well. Return precautions provided. Encouraged establish with pcp for asthma management. Patient verbalized understanding and agreeable to plan.    Final Clinical Impressions(s) / UC Diagnoses   Final diagnoses:  Mild intermittent asthma with exacerbation  Shortness of breath     Discharge Instructions     Push fluids to ensure adequate hydration and keep secretions thin.  I have refilled your daily inhaler.  Albuterol inhaler or nebulizer every 6 hours as needed for wheezing or shortness of breath .  5 days of prednisone.  Over the counter medications as needed for symptoms.  Self isolate until covid results are back and negative.  Will notify you by phone of any positive findings. Your negative results will be sent through your MyChart.     I do recommend establishing with a primary care provider to manage asthma longer term.     ED Prescriptions    Medication Sig Dispense Auth. Provider   Fluticasone Furoate (ARNUITY ELLIPTA) 100 MCG/ACT AEPB Inhale 2 puffs into the lungs daily. 30 each Zigmund Gottron, NP   albuterol (PROAIR HFA) 108 (90 Base) MCG/ACT inhaler Inhale 1-2 puffs into the lungs every 6 (six) hours as needed for wheezing or shortness of breath. 8 g Augusto Gamble B, NP   predniSONE (DELTASONE) 20 MG tablet Take 2 tablets  (40 mg total) by mouth daily with breakfast for 5 days. 10 tablet Zigmund Gottron, NP     PDMP not reviewed this encounter.   Zigmund Gottron, NP 08/24/19 1137

## 2019-08-24 LAB — NOVEL CORONAVIRUS, NAA (HOSP ORDER, SEND-OUT TO REF LAB; TAT 18-24 HRS): SARS-CoV-2, NAA: NOT DETECTED

## 2019-10-09 ENCOUNTER — Other Ambulatory Visit: Payer: BC Managed Care – PPO

## 2019-10-09 ENCOUNTER — Other Ambulatory Visit: Payer: Self-pay

## 2019-10-09 ENCOUNTER — Ambulatory Visit (INDEPENDENT_AMBULATORY_CARE_PROVIDER_SITE_OTHER): Payer: BC Managed Care – PPO | Admitting: Nurse Practitioner

## 2019-10-09 DIAGNOSIS — Z130 Encounter for screening for diseases of the blood and blood-forming organs and certain disorders involving the immune mechanism: Secondary | ICD-10-CM | POA: Diagnosis not present

## 2019-10-09 DIAGNOSIS — Z13220 Encounter for screening for lipoid disorders: Secondary | ICD-10-CM | POA: Diagnosis not present

## 2019-10-09 DIAGNOSIS — Z1231 Encounter for screening mammogram for malignant neoplasm of breast: Secondary | ICD-10-CM

## 2019-10-09 DIAGNOSIS — J4530 Mild persistent asthma, uncomplicated: Secondary | ICD-10-CM

## 2019-10-09 DIAGNOSIS — I10 Essential (primary) hypertension: Secondary | ICD-10-CM | POA: Diagnosis not present

## 2019-10-09 DIAGNOSIS — R7303 Prediabetes: Secondary | ICD-10-CM

## 2019-10-09 DIAGNOSIS — Z7689 Persons encountering health services in other specified circumstances: Secondary | ICD-10-CM

## 2019-10-09 MED ORDER — ALBUTEROL SULFATE HFA 108 (90 BASE) MCG/ACT IN AERS: 1.0000 | INHALATION_SPRAY | Freq: Four times a day (QID) | RESPIRATORY_TRACT | 0 refills | Status: DC | PRN

## 2019-10-09 MED ORDER — ARNUITY ELLIPTA 100 MCG/ACT IN AEPB: 2.0000 | INHALATION_SPRAY | Freq: Every day | RESPIRATORY_TRACT | 0 refills | Status: DC

## 2019-10-09 MED ORDER — AMLODIPINE BESYLATE 10 MG PO TABS: 10.0000 mg | ORAL_TABLET | Freq: Every day | ORAL | 2 refills | Status: DC

## 2019-10-09 NOTE — Progress Notes (Signed)
Virtual Visit via Telephone Note Due to national recommendations of social distancing due to [place] 19, telehealth visit is felt to be most appropriate for this patient at this time.  I discussed the limitations, risks, security and privacy concerns of performing an evaluation and management service by telephone and the availability of in person appointments. I also discussed with the patient that there may be a patient responsible charge related to this service. The patient expressed understanding and agreed to proceed.    I connected with Lindwood Qua on 10/09/19  at  10:50 AM EST  EDT by telephone and verified that I am speaking with the correct person using two identifiers.   Consent I discussed the limitations, risks, security and privacy concerns of performing an evaluation and management service by telephone and the availability of in person appointments. I also discussed with the patient that there may be a patient responsible charge related to this service. The patient expressed understanding and agreed to proceed.   Location of Patient: Private  Residence   Location of Provider: [place] and [employer] Office    Persons participating in Telemedicine visit: Geryl Rankins FNP-BC [place]    History of Present Illness: Telemedicine visit for: Re establish Care  has a past medical history of Allergy, Anemia, Asthma, and Hypertension.  PAP smear scheduled for tomorrow with GYN.  Mammogram is Overdue.  Will place referral today.  Asthma Well controlled.  She states about 2-3 times a year she has asthma exacerbations that require treatment in the Emergency room/urgent care.  She is currently out of her Ellipta since 1 month ago. Will refill today. She is requesting to have FMLA paperwork filled out regarding her asthma exacerbations that may require time off in the future. She was treated in the ED on 8-12 and 11-20 for asthma flare.      Essential Hypertension Currently taking amlodipine 5 mg daily.  Blood pressure is not controlled.  She states she has been on amlodipine 10 mg in the past however the dosage was decreased by a former PCP.  I am restarting her on 10 mg of amlodipine and she does have a blood pressure device at home so she will let me know her blood pressure readings via MyChart. Denies chest pain, shortness of breath, palpitations, lightheadedness, dizziness, headaches or BLE edema.  BP Readings from Last 3 Encounters:  08/23/19 (!) 123/92  05/15/19 (!) 203/114  08/07/18 (!) 149/92      Past Medical History:  Diagnosis Date  . Allergy   . Anemia   . Asthma   . Hypertension     Past Surgical History:  Procedure Laterality Date  . CESAREAN SECTION      Family History  Problem Relation Age of Onset  . Diabetes Mother   . Multiple sclerosis Mother   . Hypertension Father   . Diabetes Father     Social History   Socioeconomic History  . Marital status: Single    Spouse name: Not on file  . Number of children: Not on file  . Years of education: Not on file  . Highest education level: Not on file  Occupational History  . Not on file  Tobacco Use  . Smoking status: Never Smoker  . Smokeless tobacco: Never Used  Substance and Sexual Activity  . Alcohol use: Yes  . Drug use: No  . Sexual activity: Yes    Birth control/protection: I.U.D.  Other Topics Concern  .  Not on file  Social History Narrative  . Not on file   Social Determinants of Health   Financial Resource Strain:   . Difficulty of Paying Living Expenses: Not on file  Food Insecurity:   . Worried About Charity fundraiser in the Last Year: Not on file  . Ran Out of Food in the Last Year: Not on file  Transportation Needs:   . Lack of Transportation (Medical): Not on file  . Lack of Transportation (Non-Medical): Not on file  Physical Activity:   . Days of Exercise per Week: Not on file  . Minutes of Exercise per  Session: Not on file  Stress:   . Feeling of Stress : Not on file  Social Connections:   . Frequency of Communication with Friends and Family: Not on file  . Frequency of Social Gatherings with Friends and Family: Not on file  . Attends Religious Services: Not on file  . Active Member of Clubs or Organizations: Not on file  . Attends Archivist Meetings: Not on file  . Marital Status: Not on file     Observations/Objective: Awake, alert and oriented x 3   Review of Systems  Constitutional: Negative for fever, malaise/fatigue and weight loss.  HENT: Negative.  Negative for nosebleeds.   Eyes: Negative.  Negative for blurred vision, double vision and photophobia.  Respiratory: Negative.  Negative for cough and shortness of breath.   Cardiovascular: Negative.  Negative for chest pain, palpitations and leg swelling.  Gastrointestinal: Negative.  Negative for heartburn, nausea and vomiting.  Musculoskeletal: Negative.  Negative for myalgias.  Neurological: Negative.  Negative for dizziness, focal weakness, seizures and headaches.  Psychiatric/Behavioral: Negative.  Negative for suicidal ideas.    Assessment and Plan: Nou was seen today for establish care and follow-up.  Diagnoses and all orders for this visit:  Encounter to establish care  Essential hypertension -     amLODipine (NORVASC) 10 MG tablet; Take 1 tablet (10 mg total) by mouth daily. -     CMP14+EGFR; Future Continue all antihypertensives as prescribed.  Remember to bring in your blood pressure log with you for your follow up appointment.  DASH/Mediterranean Diets are healthier choices for HTN.   Breast cancer screening by mammogram -     Correct MAMMOGRAM; Future  Mild persistent asthma without complication -     albuterol (PROAIR HFA) 108 (90 Base) MCG/ACT inhaler; Inhale 1-2 puffs into the lungs every 6 (six) hours as needed for wheezing or shortness of breath. -     Fluticasone Furoate (ARNUITY  ELLIPTA) 100 MCG/ACT AEPB; Inhale 2 puffs into the lungs daily.  Prediabetes -     A1c; Future  Screening for deficiency anemia -     CBC; Future  Lipid screening -     Lipid Panel; Future     Follow Up Instructions Return in about 4 weeks (around 11/06/2019) for BP recheck.     I discussed the assessment and treatment plan with the patient. The patient was provided an opportunity to ask questions and all were answered. The patient agreed with the plan and demonstrated an understanding of the instructions.   The patient was advised to call back or seek an in-person evaluation if the symptoms worsen or if the condition fails to improve as anticipated.  I provided 16 minutes of non-face-to-face time during this encounter including median intraservice time, reviewing previous notes, labs, imaging, medications and explaining diagnosis and management.  Gildardo Pounds,  FNP-BC

## 2019-10-09 NOTE — Progress Notes (Signed)
Patient here for labs ordered during her virtual visit.

## 2019-10-10 DIAGNOSIS — Z30432 Encounter for removal of intrauterine contraceptive device: Secondary | ICD-10-CM | POA: Diagnosis not present

## 2019-10-10 LAB — CMP14+EGFR
ALT: 12 IU/L (ref 0–32)
AST: 13 IU/L (ref 0–40)
Albumin/Globulin Ratio: 2.1 (ref 1.2–2.2)
Albumin: 4.2 g/dL (ref 3.8–4.8)
Alkaline Phosphatase: 60 IU/L (ref 39–117)
BUN/Creatinine Ratio: 7 — ABNORMAL LOW (ref 9–23)
BUN: 5 mg/dL — ABNORMAL LOW (ref 6–24)
Bilirubin Total: 0.4 mg/dL (ref 0.0–1.2)
CO2: 23 mmol/L (ref 20–29)
Calcium: 9 mg/dL (ref 8.7–10.2)
Chloride: 107 mmol/L — ABNORMAL HIGH (ref 96–106)
Creatinine, Ser: 0.76 mg/dL (ref 0.57–1.00)
GFR calc Af Amer: 112 mL/min/{1.73_m2} (ref >59)
GFR calc non Af Amer: 97 mL/min/{1.73_m2} (ref >59)
Globulin, Total: 2 g/dL (ref 1.5–4.5)
Glucose: 112 mg/dL — ABNORMAL HIGH (ref 65–99)
Potassium: 4.1 mmol/L (ref 3.5–5.2)
Sodium: 140 mmol/L (ref 134–144)
Total Protein: 6.2 g/dL (ref 6.0–8.5)

## 2019-10-10 LAB — LIPID PANEL
Chol/HDL Ratio: 2.6 ratio (ref 0.0–4.4)
Cholesterol, Total: 94 mg/dL — ABNORMAL LOW (ref 100–199)
HDL: 36 mg/dL — ABNORMAL LOW (ref >39)
LDL Chol Calc (NIH): 34 mg/dL (ref 0–99)
Triglycerides: 140 mg/dL (ref 0–149)
VLDL Cholesterol Cal: 24 mg/dL (ref 5–40)

## 2019-10-10 LAB — CBC
Hematocrit: 43 % (ref 34.0–46.6)
Hemoglobin: 14.6 g/dL (ref 11.1–15.9)
MCH: 30.4 pg (ref 26.6–33.0)
MCHC: 34 g/dL (ref 31.5–35.7)
MCV: 90 fL (ref 79–97)
Platelets: 235 10*3/uL (ref 150–450)
RBC: 4.8 x10E6/uL (ref 3.77–5.28)
RDW: 13.2 % (ref 11.7–15.4)
WBC: 10.2 10*3/uL (ref 3.4–10.8)

## 2019-10-10 LAB — HEMOGLOBIN A1C
Est. average glucose Bld gHb Est-mCnc: 120 mg/dL
Hgb A1c MFr Bld: 5.8 % — ABNORMAL HIGH (ref 4.8–5.6)

## 2019-10-11 ENCOUNTER — Telehealth: Payer: Self-pay

## 2019-10-11 NOTE — Telephone Encounter (Signed)
She needs to call her pharmacy or insurance and ask for alternative inhaler. Thanks! I do not know what her plan covers

## 2019-10-11 NOTE — Telephone Encounter (Signed)
Arnuity Ellipta inhaler isn't covered by [employer]. Is there an alternate that you can prescribe?

## 2019-10-14 NOTE — Telephone Encounter (Signed)
Done

## 2019-11-12 ENCOUNTER — Telehealth: Payer: Self-pay

## 2019-11-12 NOTE — Telephone Encounter (Signed)

## 2019-11-13 ENCOUNTER — Ambulatory Visit: Payer: BC Managed Care – PPO | Admitting: Internal Medicine

## 2019-12-03 ENCOUNTER — Telehealth: Payer: Self-pay

## 2019-12-03 NOTE — Telephone Encounter (Signed)
Called patient to do their pre-visit COVID screening.  Call went to voicemail. Unable to do prescreening.  

## 2019-12-04 ENCOUNTER — Ambulatory Visit (INDEPENDENT_AMBULATORY_CARE_PROVIDER_SITE_OTHER): Payer: BC Managed Care – PPO | Admitting: Internal Medicine

## 2019-12-04 ENCOUNTER — Other Ambulatory Visit: Payer: Self-pay

## 2019-12-04 ENCOUNTER — Encounter: Payer: Self-pay | Admitting: Internal Medicine

## 2019-12-04 VITALS — BP 131/84 | HR 82 | Temp 97.3°F | Resp 17 | Ht 63.0 in | Wt 196.0 lb

## 2019-12-04 DIAGNOSIS — M549 Dorsalgia, unspecified: Secondary | ICD-10-CM

## 2019-12-04 DIAGNOSIS — I10 Essential (primary) hypertension: Secondary | ICD-10-CM | POA: Diagnosis not present

## 2019-12-04 DIAGNOSIS — J4520 Mild intermittent asthma, uncomplicated: Secondary | ICD-10-CM | POA: Diagnosis not present

## 2019-12-04 MED ORDER — CYCLOBENZAPRINE HCL 10 MG PO TABS: 10.0000 mg | ORAL_TABLET | Freq: Three times a day (TID) | ORAL | 0 refills | Status: DC | PRN

## 2019-12-04 NOTE — Progress Notes (Signed)
  Subjective:    Kelly Meyers - 44 y.o. female MRN [id number]  Date of birth: 25-Jun-1976  HPI  Kelly Meyers is here for HTN follow up.  Chronic HTN Disease Monitoring:  Home BP Monitoring - Yes monitors at home. Always <140/90.  Chest pain- no  Dyspnea- no Headache - no  Medications: Amlodipine 10 mg (increased from 5 mg at last visit 1/6)  Compliance- yes Lightheadedness- no  Edema- no   Back Pain: Reports some upper back pain that starts near her shoulder blades and radiates down her arms. She feels very tense. Pain started a couple days ago. Improved after soaking in warm bath but then was bothering her again the next morning. 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     Health Maintenance Due  Topic Date Due  . HIV Screening  09/09/1992  . TETANUS/TDAP  10/04/2019    -  reports that she has never smoked. She has never used smokeless tobacco. - Review of Systems: Per HPI. - Past Medical History: Patient Active Problem List   Diagnosis Date Noted  . Recurrent boils 08/09/2018  . ALLERGIC RHINITIS 12/26/2007  . ASTHMA 12/26/2007   - Medications: reviewed and updated   Objective:   Physical Exam BP 131/84   Pulse 82   Temp (!) 97.3 F (36.3 C) (Temporal)   Resp 17   Ht 5\' 3"  (1.6 m)   Wt 196 lb (88.9 kg)   SpO2 96%   BMI 34.72 kg/m  Physical Exam  Constitutional: She is oriented to person, place, and time and well-developed, well-nourished, and in no distress. No distress.  HENT:  Head: Normocephalic and atraumatic.  Eyes: Conjunctivae and EOM are normal.  Cardiovascular: Normal rate, regular rhythm and normal heart sounds.  No murmur heard. Pulmonary/Chest: Effort normal and breath sounds normal. No respiratory distress. She has no wheezes. She has no rales.  Musculoskeletal:        General: Normal range of motion.     Comments: Upper back feels tight bilaterally across trapezius muscles. Full ROM and strength in UE.    Neurological: She is alert and oriented to person, place, and time.  Skin: Skin is warm and dry. She is not diaphoretic.  Psychiatric: Affect and judgment normal.           Assessment & Plan:   1. Essential hypertension BP well controlled today. Continue current therapy.  Counseled on blood pressure goal of less than 130/80, low-sodium, DASH diet, medication compliance, 150 minutes of moderate intensity exercise per week. Discussed medication compliance, adverse effects.  2. Upper back pain Suspect related to muscle tension likely from posture while working at computer. Encouraged more awareness of posture, opening up through chest, and getting up every hour from computer station. Will prescribe trial of flexeril for acute muscle tension. Advised to use heat, OTC analgesics as needed.  - cyclobenzaprine (FLEXERIL) 10 MG tablet; Take 1 tablet (10 mg total) by mouth 3 (three) times daily as needed for muscle spasms.  Dispense: 30 tablet; Refill: 0  3. Mild intermittent asthma without complication Patient was concerned back tightness might have been related to asthma. Has used albuterol inhaler/neb 2x over the past 3-4 days. Lungs are clear. Do not suspect this is related to an asthma exacerbation.      , D.O. 12/04/2019, 2:17 PM Primary Care at [facility]

## 2019-12-04 NOTE — Patient Instructions (Signed)
Thank you for choosing Primary Care at [facility] to be your medical home!    Kelly Meyers was seen by De Hollingshead, DO today.   Kelly Meyers's primary care provider is Marcy Siren, DO.   For the best care possible, you should try to see Marcy Siren, DO whenever you come to the clinic.   We look forward to seeing you again soon!  If you have any questions about your visit today, please call us at [phone] or feel free to reach your primary care provider via MyChart.

## 2020-02-21 ENCOUNTER — Other Ambulatory Visit: Payer: Self-pay

## 2020-02-21 ENCOUNTER — Emergency Department (HOSPITAL_COMMUNITY)
Admission: EM | Admit: 2020-02-21 | Discharge: 2020-02-22 | Disposition: A | Discharge status: Home / Self Care | Payer: BC Managed Care – PPO | Attending: Emergency Medicine | Admitting: Emergency Medicine

## 2020-02-21 ENCOUNTER — Encounter (HOSPITAL_COMMUNITY): Payer: Self-pay | Admitting: Emergency Medicine

## 2020-02-21 DIAGNOSIS — I10 Essential (primary) hypertension: Secondary | ICD-10-CM | POA: Diagnosis not present

## 2020-02-21 DIAGNOSIS — K0889 Other specified disorders of teeth and supporting structures: Secondary | ICD-10-CM | POA: Diagnosis not present

## 2020-02-21 DIAGNOSIS — Z79899 Other long term (current) drug therapy: Secondary | ICD-10-CM | POA: Insufficient documentation

## 2020-02-21 MED ORDER — IBUPROFEN 400 MG PO TABS
400.0000 mg | ORAL_TABLET | Freq: Once | ORAL | Status: AC
  Administered 2020-02-21: 400 mg via ORAL
  Filled 2020-02-21: qty 1

## 2020-02-21 NOTE — ED Triage Notes (Signed)
Patient reports left lower dental pain with swelling onset today , denies fever or chills , respirations unlabored.

## 2020-02-22 MED ORDER — PENICILLIN V POTASSIUM 250 MG PO TABS
500.0000 mg | ORAL_TABLET | Freq: Once | ORAL | Status: AC
  Administered 2020-02-22: 500 mg via ORAL
  Filled 2020-02-22: qty 2

## 2020-02-22 MED ORDER — PENICILLIN V POTASSIUM 500 MG PO TABS: 500.0000 mg | ORAL_TABLET | Freq: Four times a day (QID) | ORAL | 0 refills | Status: DC

## 2020-02-22 NOTE — ED Provider Notes (Signed)
[facility] EMERGENCY DEPARTMENT Provider Note   CSN: [id number] Arrival date & time: 02/21/20  2150     History Chief Complaint  Patient presents with  . Dental Pain    Kelly Meyers is a 44 y.o. female.  Patient presents to the ED with a chief complaint of dental pain.  She reports that she has had gradually worsening dental pain on the left lower side.  She is working to get in and see a Pharmacist, community.  She denies any successful treatments.  She reports mild swelling.  She in concerned for infection.  She is also needs a work note.  The history is provided by the patient. No language interpreter was used.       Past Medical History:  Diagnosis Date  . Allergy   . Anemia   . Asthma   . Hypertension     Patient Active Problem List   Diagnosis Date Noted  . Recurrent boils 08/09/2018  . ALLERGIC RHINITIS 12/26/2007  . Asthma 12/26/2007    Past Surgical History:  Procedure Laterality Date  . CESAREAN SECTION       OB History   No obstetric history on file.     Family History  Problem Relation Age of Onset  . Diabetes Mother   . Multiple sclerosis Mother   . Hypertension Father   . Diabetes Father     Social History   Tobacco Use  . Smoking status: Never Smoker  . Smokeless tobacco: Never Used  Substance Use Topics  . Alcohol use: Yes  . Drug use: No    Home Medications Prior to Admission medications   Medication Sig Start Date End Date Taking? Authorizing Provider  albuterol (PROAIR HFA) 108 (90 Base) MCG/ACT inhaler Inhale 1-2 puffs into the lungs every 6 (six) hours as needed for wheezing or shortness of breath. 10/09/19   Gildardo Pounds, NP  albuterol (PROVENTIL) (2.5 MG/3ML) 0.083% nebulizer solution Take 3 mLs (2.5 mg total) by nebulization every 6 (six) hours as needed for wheezing or shortness of breath. 05/15/19   Jaynee Eagles, PA-C  amLODipine (NORVASC) 10 MG tablet Take 1 tablet (10 mg total) by mouth daily. 10/09/19 01/07/20   Gildardo Pounds, NP  cyclobenzaprine (FLEXERIL) 10 MG tablet Take 1 tablet (10 mg total) by mouth 3 (three) times daily as needed for muscle spasms. 12/04/19   Nicolette Bang, DO  Fluticasone Furoate (ARNUITY ELLIPTA) 100 MCG/ACT AEPB Inhale 2 puffs into the lungs daily. 10/09/19   Gildardo Pounds, NP  penicillin v potassium (VEETID) 500 MG tablet Take 1 tablet (500 mg total) by mouth 4 (four) times daily. 02/22/20   Montine Circle, PA-C    Allergies    Shellfish allergy, Onion, and Oxycodone  Review of Systems   Review of Systems  Constitutional: Negative for chills and fever.  HENT: Positive for dental problem. Negative for drooling.   Neurological: Negative for speech difficulty.    Physical Exam Updated Vital Signs BP (!) 153/85 (BP Location: Left Arm)   Pulse 72   Temp 98.3 F (36.8 C) (Oral)   Resp 16   Ht 5\' 1"  (1.549 m)   Wt 90 kg   LMP 02/20/2020   SpO2 100%   BMI 37.49 kg/m   Physical Exam Physical Exam  Constitutional: Pt appears well-developed and well-nourished.  HENT:  Head: Normocephalic.  Right Ear: Tympanic membrane, external ear and ear canal normal.  Left Ear: Tympanic membrane, external ear and  ear canal normal.  Nose: Nose normal. Right sinus exhibits no maxillary sinus tenderness and no frontal sinus tenderness. Left sinus exhibits no maxillary sinus tenderness and no frontal sinus tenderness.  Mouth/Throat: Uvula is midline, oropharynx is clear and moist and mucous membranes are normal. No oral lesions. No uvula swelling or lacerations. No oropharyngeal exudate, posterior oropharyngeal edema, posterior oropharyngeal erythema or tonsillar abscesses.  No gingival swelling, fluctuance or induration No gross abscess  No sublingual edema, tenderness to palpation, or sign of Ludwig's angina, or deep space infection Pain at left lower rear molar with decay of the tooth Eyes: Conjunctivae are normal. Pupils are equal, round, and reactive to light.  Right eye exhibits no discharge. Left eye exhibits no discharge.  Neck: Normal range of motion. Neck supple.  No stridor Handling secretions without difficulty No nuchal rigidity No cervical lymphadenopathy Cardiovascular: Normal rate, regular rhythm and normal heart sounds.   Pulmonary/Chest: Effort normal. No respiratory distress.  Equal chest rise  Abdominal: Soft. Bowel sounds are normal. Pt exhibits no distension. There is no tenderness.  Lymphadenopathy: Pt has no cervical adenopathy.  Neurological: Pt is alert and oriented x 4  Skin: Skin is warm and dry.  Psychiatric: Pt has a normal mood and affect.  Nursing note and vitals reviewed.    ED Results / Procedures / Treatments   Labs (all labs ordered are listed, but only abnormal results are displayed) Labs Reviewed - No data to display  EKG None  Radiology No results found.  Procedures Procedures (including critical care time)  Medications Ordered in ED Medications  penicillin v potassium (VEETID) tablet 500 mg (has no administration in time range)  ibuprofen (ADVIL) tablet 400 mg (400 mg Oral Given 02/21/20 2230)    ED Course  I have reviewed the triage vital signs and the nursing notes.  Pertinent labs & imaging results that were available during my care of the patient were reviewed by me and considered in my medical decision making (see chart for details).    MDM Rules/Calculators/A&P                       Patient with dentalgia.  No abscess requiring immediate incision and drainage.  Exam not concerning for Ludwig's angina or pharyngeal abscess.  Will treat with penicillin. Pt instructed to follow-up with dentist.  Discussed return precautions. Pt safe for discharge.    Final Clinical Impression(s) / ED Diagnoses Final diagnoses:  Pain, dental    Rx / DC Orders ED Discharge Orders         Ordered    penicillin v potassium (VEETID) 500 MG tablet  4 times daily     02/22/20 0018             Roxy Horseman, PA-C 02/22/20 0024    Palumbo, April, MD 02/22/20 6568

## 2020-03-05 ENCOUNTER — Telehealth (INDEPENDENT_AMBULATORY_CARE_PROVIDER_SITE_OTHER): Payer: BC Managed Care – PPO | Admitting: Internal Medicine

## 2020-03-05 ENCOUNTER — Encounter: Payer: Self-pay | Admitting: Internal Medicine

## 2020-03-05 DIAGNOSIS — J4530 Mild persistent asthma, uncomplicated: Secondary | ICD-10-CM

## 2020-03-05 DIAGNOSIS — I10 Essential (primary) hypertension: Secondary | ICD-10-CM | POA: Diagnosis not present

## 2020-03-05 MED ORDER — ARNUITY ELLIPTA 100 MCG/ACT IN AEPB: 2.0000 | INHALATION_SPRAY | Freq: Every day | RESPIRATORY_TRACT | 1 refills | Status: DC

## 2020-03-05 NOTE — Progress Notes (Signed)
Virtual Visit via Telephone Note  I connected with Kelly Meyers, on 03/05/2020 at 1:34 PM by telephone due to the COVID-19 pandemic and verified that I am speaking with the correct person using two identifiers.   Consent: I discussed the limitations, risks, security and privacy concerns of performing an evaluation and management service by telephone and the availability of in person appointments. I also discussed with the patient that there may be a patient responsible charge related to this service. The patient expressed understanding and agreed to proceed.   Location of Patient: Home   Location of Provider: Clinic    Persons participating in Telemedicine visit: Semaj Kham [facility] Dr. Juleen China      History of Present Illness: Patient has a visit to f/u on chronic medical conditions.   Chronic HTN Disease Monitoring:  Home BP Monitoring - 130-140s/70-80s  Chest pain- no  Dyspnea- no Headache - no  Medications: Amlodipine 10 mg  Compliance- yes Lightheadedness- no  Edema- no   Asthma: Reports has been managed well recently without any flare ups or concerns. No SOB, chest tightness, new cough. Requests inhaler for Fluticasone.     Past Medical History:  Diagnosis Date   Allergy    Anemia    Asthma    Hypertension    Allergies  Allergen Reactions   Shellfish Allergy Anaphylaxis and Itching   Onion Itching   Oxycodone Itching    Current Outpatient Medications on File Prior to Visit  Medication Sig Dispense Refill   albuterol (PROAIR HFA) 108 (90 Base) MCG/ACT inhaler Inhale 1-2 puffs into the lungs every 6 (six) hours as needed for wheezing or shortness of breath. 8 g 0   albuterol (PROVENTIL) (2.5 MG/3ML) 0.083% nebulizer solution Take 3 mLs (2.5 mg total) by nebulization every 6 (six) hours as needed for wheezing or shortness of breath. 75 mL 0   amLODipine (NORVASC) 10 MG tablet Take 1 tablet (10 mg total) by  mouth daily. 90 tablet 2   Fluticasone Furoate (ARNUITY ELLIPTA) 100 MCG/ACT AEPB Inhale 2 puffs into the lungs daily. 60 each 0   No current facility-administered medications on file prior to visit.    Observations/Objective: NAD. Speaking clearly.  Work of breathing normal.  Alert and oriented. Mood appropriate.   Assessment and Plan: 1. Essential hypertension BPs sound close to goal but perhaps slightly elevated with home readings in 140s. Will plan to monitor further at future in person visits. Patient asymptomatic. Continue Amlodipine. Emphasized diet and exercise to improve BP control.   2 Mild persistent asthma without complication Symptoms well controlled on ICS and Albuterol prn use. No evidence of exacerbation.  - Fluticasone Furoate (ARNUITY ELLIPTA) 100 MCG/ACT AEPB; Inhale 2 puffs into the lungs daily.  Dispense: 90 each; Refill: 1   Follow Up Instructions: 3 month f/u for chronic medical conditions    I discussed the assessment and treatment plan with the patient. The patient was provided an opportunity to ask questions and all were answered. The patient agreed with the plan and demonstrated an understanding of the instructions.   The patient was advised to call back or seek an in-person evaluation if the symptoms worsen or if the condition fails to improve as anticipated.     I provided 12 minutes total of non-face-to-face time during this encounter including median intraservice time, reviewing previous notes, investigations, ordering medications, medical decision making, coordinating care and patient verbalized understanding at the end of the visit.    Barnetta Chapel  Earlene Plater, D.O. Primary Care at [facility]  03/05/2020, 1:34 PM

## 2020-05-15 ENCOUNTER — Ambulatory Visit (HOSPITAL_COMMUNITY)
Admission: EM | Admit: 2020-05-15 | Discharge: 2020-05-15 | Disposition: A | Discharge status: Home / Self Care | Payer: BC Managed Care – PPO | Attending: Family Medicine | Admitting: Family Medicine

## 2020-05-15 ENCOUNTER — Other Ambulatory Visit: Payer: Self-pay

## 2020-05-15 ENCOUNTER — Encounter (HOSPITAL_COMMUNITY): Payer: Self-pay

## 2020-05-15 DIAGNOSIS — R059 Cough, unspecified: Secondary | ICD-10-CM

## 2020-05-15 DIAGNOSIS — J4541 Moderate persistent asthma with (acute) exacerbation: Secondary | ICD-10-CM

## 2020-05-15 DIAGNOSIS — R062 Wheezing: Secondary | ICD-10-CM

## 2020-05-15 DIAGNOSIS — R05 Cough: Secondary | ICD-10-CM

## 2020-05-15 MED ORDER — PREDNISONE 10 MG (21) PO TBPK: ORAL_TABLET | Freq: Every day | ORAL | 0 refills | Status: DC

## 2020-05-15 NOTE — ED Triage Notes (Signed)
Pt reports cough and wheezing x 2 weeks. Pt using albuterol inhaler twice a day and Advair one a day with some what relief. Denies fever or chills.

## 2020-05-16 NOTE — ED Provider Notes (Addendum)
[facility] CARE CENTER   [id number] 05/15/20 Arrival Time: 1557  ASSESSMENT & PLAN:  1. Moderate persistent asthma with exacerbation   2. Wheezing   3. Cough     Unclear trigger. No respiratory distress.  Begin: Meds ordered this encounter  Medications   predniSONE (STERAPRED UNI-PAK 21 TAB) 10 MG (21) TBPK tablet    Sig: Take by mouth daily. Take as directed.    Dispense:  21 tablet    Refill:  0    Albuterol as needed. Asthma precautions given. OTC symptom care as needed.  Recommend:  Follow-up Information    Arvilla Market, DO.   Specialty: Family Medicine Why: As needed. Contact information: [address] [place] [address] [phone]        [facility] [place].   Specialty: Urgent Care Why: If worsening or failing to improve as anticipated. Contact information: [address] [place] [address] [phone]              Reviewed expectations re: course of current medical issues. Questions answered. Outlined signs and symptoms indicating need for more acute intervention. Patient verbalized understanding. After Visit Summary given.  SUBJECTIVE: History from: patient.  Kelly Meyers is a 44 y.o. female who presents with complaint of fairly persistent wheezing. Onset gradual, over the past 1-2 weeks. Triggers: unknown. Describes wheezing as moderate when present. Fever: no. Overall normal PO intake without n/v. Sick contacts: no. Ambulatory without difficulty. No LE edema. Typically her asthma is well controlled. Inhaler use: more frequent recently. OTC treatment: none. No known COVID exposures.  Social History   Tobacco Use  Smoking Status Never Smoker  Smokeless Tobacco Never Used    OBJECTIVE:  Vitals:   05/15/20 1707  BP: (!) 144/83  Pulse: 86  Resp: 20  Temp: 98.6 F (37 C)  TempSrc: Oral  SpO2: 97%     General appearance: alert; NAD HEENT: [place]; AT; without nasal  congestion Neck: supple without LAD Cv: RRR without murmer Lungs: unlabored respirations, mild to moderate bilateral expiratory wheezing; cough: mild, dry; no significant respiratory distress Skin: warm and dry Psychological: alert and cooperative; normal mood and affect  Allergies  Allergen Reactions   Shellfish Allergy Anaphylaxis and Itching   Onion Itching   Oxycodone Itching    Past Medical History:  Diagnosis Date   Allergy    Anemia    Asthma    Hypertension    Family History  Problem Relation Age of Onset   Diabetes Mother    Multiple sclerosis Mother    Hypertension Father    Diabetes Father    Social History   Socioeconomic History   Marital status: Single    Spouse name: Not on file   Number of children: Not on file   Years of education: Not on file   Highest education level: Not on file  Occupational History   Not on file  Tobacco Use   Smoking status: Never Smoker   Smokeless tobacco: Never Used  Substance and Sexual Activity   Alcohol use: Yes   Drug use: No   Sexual activity: Yes    Birth control/protection: I.U.D.  Other Topics Concern   Not on file  Social History Narrative   Not on file   Social Determinants of Health   Financial Resource Strain:    Difficulty of Paying Living Expenses:   Food Insecurity:    Worried About Running Out of Food in the Last Year:  Ran Out of Food in the Last Year:   Transportation Needs:    Freight forwarder (Medical):    Lack of Transportation (Non-Medical):   Physical Activity:    Days of Exercise per Week:    Minutes of Exercise per Session:   Stress:    Feeling of Stress :   Social Connections:    Frequency of Communication with Friends and Family:    Frequency of Social Gatherings with Friends and Family:    Attends Religious Services:    Active Member of Clubs or Organizations:    Attends Banker Meetings:    Marital Status:   Intimate  Partner Violence:    Fear of Current or Ex-Partner:    Emotionally Abused:    Physically Abused:    Sexually Abused:             Mardella Layman, MD 05/16/20 1009    Mardella Layman, MD 05/16/20 1009

## 2020-06-27 ENCOUNTER — Encounter (HOSPITAL_COMMUNITY): Payer: Self-pay

## 2020-06-27 ENCOUNTER — Ambulatory Visit (HOSPITAL_COMMUNITY)
Admission: EM | Admit: 2020-06-27 | Discharge: 2020-06-27 | Disposition: A | Discharge status: Home / Self Care | Payer: BC Managed Care – PPO | Attending: Urgent Care | Admitting: Urgent Care

## 2020-06-27 ENCOUNTER — Other Ambulatory Visit: Payer: Self-pay

## 2020-06-27 DIAGNOSIS — R05 Cough: Secondary | ICD-10-CM | POA: Insufficient documentation

## 2020-06-27 DIAGNOSIS — J4541 Moderate persistent asthma with (acute) exacerbation: Secondary | ICD-10-CM | POA: Diagnosis not present

## 2020-06-27 DIAGNOSIS — I10 Essential (primary) hypertension: Secondary | ICD-10-CM | POA: Diagnosis not present

## 2020-06-27 DIAGNOSIS — Z20822 Contact with and (suspected) exposure to covid-19: Secondary | ICD-10-CM | POA: Diagnosis not present

## 2020-06-27 DIAGNOSIS — Z885 Allergy status to narcotic agent status: Secondary | ICD-10-CM | POA: Insufficient documentation

## 2020-06-27 DIAGNOSIS — J069 Acute upper respiratory infection, unspecified: Secondary | ICD-10-CM | POA: Insufficient documentation

## 2020-06-27 DIAGNOSIS — Z79899 Other long term (current) drug therapy: Secondary | ICD-10-CM | POA: Insufficient documentation

## 2020-06-27 LAB — SARS CORONAVIRUS 2 (TAT 6-24 HRS): SARS Coronavirus 2: NEGATIVE

## 2020-06-27 MED ORDER — PROMETHAZINE-DM 6.25-15 MG/5ML PO SYRP: 5.0000 mL | ORAL_SOLUTION | Freq: Four times a day (QID) | ORAL | 0 refills | Status: DC | PRN

## 2020-06-27 MED ORDER — PREDNISONE 10 MG PO TABS: ORAL_TABLET | ORAL | 0 refills | Status: DC

## 2020-06-27 NOTE — ED Provider Notes (Signed)
[facility]    CSN: [id number] Arrival date & time: 06/27/20  1210      History   Chief Complaint Chief Complaint  Patient presents with  . Cough    HPI Kelly Meyers is a 44 y.o. female.   Here today with 1 day of sore throat, productive cough, chest tightness, body aches, malaise, wheezes. Denies fever, headache, CP, SOB, N/V/D. Tried some sudafed and taking her typical allergy and asthma regimen without much benefit. States her asthma flared up about a week ago and that has been ongoing but other sxs started yesterday. Per record review, looks like she just completed a steroid taper less than a month ago for an asthma exacerbation.      Past Medical History:  Diagnosis Date  . Allergy   . Anemia   . Asthma   . Hypertension     Patient Active Problem List   Diagnosis Date Noted  . Recurrent boils 08/09/2018  . ALLERGIC RHINITIS 12/26/2007  . Asthma 12/26/2007    Past Surgical History:  Procedure Laterality Date  . CESAREAN SECTION      OB History   No obstetric history on file.      Home Medications    Prior to Admission medications   Medication Sig Start Date End Date Taking? Authorizing Provider  albuterol (PROAIR HFA) 108 (90 Base) MCG/ACT inhaler Inhale 1-2 puffs into the lungs every 6 (six) hours as needed for wheezing or shortness of breath. 10/09/19   Claiborne Rigg, NP  albuterol (PROVENTIL) (2.5 MG/3ML) 0.083% nebulizer solution Take 3 mLs (2.5 mg total) by nebulization every 6 (six) hours as needed for wheezing or shortness of breath. 05/15/19   Wallis Bamberg, PA-C  amLODipine (NORVASC) 10 MG tablet Take 1 tablet (10 mg total) by mouth daily. 10/09/19   Claiborne Rigg, NP  Fluticasone Furoate (ARNUITY ELLIPTA) 100 MCG/ACT AEPB Inhale 2 puffs into the lungs daily. 03/05/20   Arvilla Market, DO  predniSONE (DELTASONE) 10 MG tablet Take 6 tabs day one, 5 tabs day two, 4 tabs day three, etc 06/27/20   Particia Nearing, PA-C   promethazine-dextromethorphan (PROMETHAZINE-DM) 6.25-15 MG/5ML syrup Take 5 mLs by mouth 4 (four) times daily as needed for cough. 06/27/20   Particia Nearing, PA-C    Family History Family History  Problem Relation Age of Onset  . Diabetes Mother   . Multiple sclerosis Mother   . Hypertension Father   . Diabetes Father     Social History Social History   Tobacco Use  . Smoking status: Never Smoker  . Smokeless tobacco: Never Used  Substance Use Topics  . Alcohol use: Yes    Comment: occ  . Drug use: No     Allergies   Shellfish allergy, Onion, and Oxycodone   Review of Systems Review of Systems PER HPI    Physical Exam Triage Vital Signs ED Triage Vitals  Enc Vitals Group     BP 06/27/20 1322 (!) 157/72     Pulse Rate 06/27/20 1322 68     Resp 06/27/20 1322 16     Temp 06/27/20 1322 98.3 F (36.8 C)     Temp Source 06/27/20 1322 Oral     SpO2 06/27/20 1322 100 %     Weight 06/27/20 1325 190 lb (86.2 kg)     Height 06/27/20 1325 5\' 1"  (1.549 m)     Head Circumference --      Peak Flow --  Pain Score 06/27/20 1325 5     Pain Loc --      Pain Edu? --      Excl. in GC? --    No data found.  Updated Vital Signs BP (!) 157/72   Pulse 68   Temp 98.3 F (36.8 C) (Oral)   Resp 16   Ht 5\' 1"  (1.549 m)   Wt 190 lb (86.2 kg)   SpO2 100%   BMI 35.90 kg/m   Visual Acuity Right Eye Distance:   Left Eye Distance:   Bilateral Distance:    Right Eye Near:   Left Eye Near:    Bilateral Near:     Physical Exam Vitals and nursing note reviewed.  Constitutional:      Appearance: Normal appearance. She is not ill-appearing.  HENT:     Head: Atraumatic.     Right Ear: Tympanic membrane normal.     Left Ear: Tympanic membrane normal.     Nose: Rhinorrhea present.     Mouth/Throat:     Mouth: Mucous membranes are moist.     Pharynx: Posterior oropharyngeal erythema present.  Eyes:     Extraocular Movements: Extraocular movements intact.      Conjunctiva/sclera: Conjunctivae normal.  Cardiovascular:     Rate and Rhythm: Normal rate and regular rhythm.     Heart sounds: Normal heart sounds.  Pulmonary:     Effort: Pulmonary effort is normal.     Breath sounds: Wheezing (mild wheezes b/l) present.  Abdominal:     General: Bowel sounds are normal. There is no distension.     Palpations: Abdomen is soft.     Tenderness: There is no abdominal tenderness. There is no guarding.  Musculoskeletal:        General: Normal range of motion.     Cervical back: Normal range of motion and neck supple.  Skin:    General: Skin is warm and dry.  Neurological:     Mental Status: She is alert and oriented to person, place, and time.  Psychiatric:        Mood and Affect: Mood normal.        Thought Content: Thought content normal.        Judgment: Judgment normal.     UC Treatments / Results  Labs (all labs ordered are listed, but only abnormal results are displayed) Labs Reviewed  SARS CORONAVIRUS 2 (TAT 6-24 HRS)    EKG   Radiology No results found.  Procedures Procedures (including critical care time)  Medications Ordered in UC Medications - No data to display  Initial Impression / Assessment and Plan / UC Course  I have reviewed the triage vital signs and the nursing notes.  Pertinent labs & imaging results that were available during my care of the patient were reviewed by me and considered in my medical decision making (see chart for details).     Suspect viral illness causing asthma exacerbation. Will treat with steroid taper, phenergan DM and mucinex in addition to her typical allergy and inhaler regimen. COVID pcr pending, note given and isolation protocol reviewed. F/u with PCP in 2 weeks if not fully resolved and to discuss possibly poor asthma control.   Final Clinical Impressions(s) / UC Diagnoses   Final diagnoses:  Viral URI with cough  Moderate persistent asthma with exacerbation   Discharge  Instructions   None    ED Prescriptions    Medication Sig Dispense Auth. Provider   predniSONE (DELTASONE) 10  MG tablet Take 6 tabs day one, 5 tabs day two, 4 tabs day three, etc 21 tablet Particia Nearing, PA-C   promethazine-dextromethorphan (PROMETHAZINE-DM) 6.25-15 MG/5ML syrup Take 5 mLs by mouth 4 (four) times daily as needed for cough. 100 mL Particia Nearing, [place]     PDMP not reviewed this encounter.   Particia Nearing, [place] 06/27/20 1421

## 2020-06-27 NOTE — ED Triage Notes (Signed)
Pt c/o productive cough w/yellow mucous, chest tightness, sore throat and HA started yesterday. Pt states has asthma and feels like it's her asthma flaring up.

## 2020-10-23 ENCOUNTER — Telehealth (INDEPENDENT_AMBULATORY_CARE_PROVIDER_SITE_OTHER): Payer: BC Managed Care – PPO | Admitting: Internal Medicine

## 2020-10-23 ENCOUNTER — Encounter: Payer: Self-pay | Admitting: Internal Medicine

## 2020-10-23 NOTE — Progress Notes (Signed)
Patient had a virtual visit scheduled. RN Eloise Levels contacted patient for triage. This provider then attempted to call patient to conduct visit but was unsuccessful. VM left to return call but no call received. Will close chart and reschedule patient if she contacts office later.    Kelly Meyers, D.O. Primary Care at [facility]  10/23/2020, 10:26 AM

## 2020-10-23 NOTE — Progress Notes (Signed)
Had a mild cold beginning of Jan, still has cough and mucous, worse at night/1st thing in AM, needs refill on inhaler

## 2020-10-27 ENCOUNTER — Telehealth: Payer: Self-pay

## 2020-10-27 NOTE — Telephone Encounter (Signed)
1) Medication(s) Requested (by name):albuterol [facility] HFA) 108 (90 Base) MCG/ACT inhaler [[id number]]  predniSONE (DELTASONE) 10 MG tablet [[id number] promethazine-dextromethorphan (PROMETHAZINE-DM) 6.25-15 MG/5ML syrup [[id number]]    2) Pharmacy of Choice:[id number] - [place], [place] - [address] AT [facility] [address]  [address], [place] [address]  Phone:  [phone] Fax:  [phone] Special Requests:   Approved medications will be sent to the pharmacy, we will reach out if there is an issue.  Requests made after 3pm may not be addressed until the following business day!  If a patient is unsure of the name of the medication(s) please note and ask patient to call back when they are able to provide all info, do not send to responsible party until all information is available!

## 2020-10-27 NOTE — Telephone Encounter (Signed)
Patient never completed telephone visit with provider. Patient will need to complete a telephone visit with a provider to receive refill on inhaler and requested prescriptions.

## 2020-10-27 NOTE — Telephone Encounter (Signed)
Called pt made an appointment with mobile med

## 2020-10-28 ENCOUNTER — Other Ambulatory Visit: Payer: Self-pay | Admitting: Nurse Practitioner

## 2020-10-28 ENCOUNTER — Other Ambulatory Visit: Payer: Self-pay

## 2020-10-28 ENCOUNTER — Telehealth (INDEPENDENT_AMBULATORY_CARE_PROVIDER_SITE_OTHER): Payer: BC Managed Care – PPO | Admitting: Physician Assistant

## 2020-10-28 ENCOUNTER — Encounter: Payer: Self-pay | Admitting: Physician Assistant

## 2020-10-28 DIAGNOSIS — R058 Other specified cough: Secondary | ICD-10-CM | POA: Diagnosis not present

## 2020-10-28 DIAGNOSIS — J4530 Mild persistent asthma, uncomplicated: Secondary | ICD-10-CM | POA: Diagnosis not present

## 2020-10-28 MED ORDER — PREDNISONE 20 MG PO TABS: ORAL_TABLET | ORAL | 0 refills | Status: AC

## 2020-10-28 NOTE — Progress Notes (Signed)
Patient verified DOB Patient complains of cough beginning first week of January. Patient has had to use inhaler and nebulizer more frequently. Patient used nebulizer this morning. Patient reports HA, body aches. Patient denies loss of taste or smell. No N/V diarrhea or fevers at home. Patients partner began similar symptoms a few days after her.

## 2020-10-28 NOTE — Patient Instructions (Signed)
I encourage you to continue using your nebulizer and inhaler as directed.  I encourage you to try Mucinex over-the-counter, I sent a steroid taper to your pharmacy.  Continue to stay well-hydrated and get plenty of rest.  I do encourage you to become vaccinated against Covid.  You can find vaccine information on the [facility] health website  COVID-19 Vaccine Information can be found at: [email or web address] For questions related to vaccine distribution or appointments, please email [email or web address] or call [phone].   I hope that you feel better soon, please let us know if there is anything else we can do for you  Kelly Jaffe, PA-C Physician Assistant [facility] [email or web address]    Cough, Adult Coughing is a reflex that clears your throat and your airways (respiratory system). Coughing helps to heal and protect your lungs. It is normal to cough occasionally, but a cough that happens with other symptoms or lasts a long time may be a sign of a condition that needs treatment. An acute cough may only last 2-3 weeks, while a chronic cough may last 8 or more weeks. Coughing is commonly caused by:  Infection of the respiratory systemby viruses or bacteria.  Breathing in substances that irritate your lungs.  Allergies.  Asthma.  Mucus that runs down the back of your throat (postnasal drip).  Smoking.  Acid backing up from the stomach into the esophagus (gastroesophageal reflux).  Certain medicines.  Chronic lung problems.  Other medical conditions such as heart failure or a blood clot in the lung (pulmonary embolism). Follow these instructions at home: Medicines  Take over-the-counter and prescription medicines only as told by your health care provider.  Talk with your health care provider before you take a cough suppressant medicine. Lifestyle  Avoid  cigarette smoke. Do not use any products that contain nicotine or tobacco, such as cigarettes, e-cigarettes, and chewing tobacco. If you need help quitting, ask your health care provider.  Drink enough fluid to keep your urine pale yellow.  Avoid caffeine.  Do not drink alcohol if your health care provider tells you not to drink.   General instructions  Pay close attention to changes in your cough. Tell your health care provider about them.  Always cover your mouth when you cough.  Avoid things that make you cough, such as perfume, candles, cleaning products, or campfire or tobacco smoke.  If the air is dry, use a cool mist vaporizer or humidifier in your bedroom or your home to help loosen secretions.  If your cough is worse at night, try to sleep in a semi-upright position.  Rest as needed.  Keep all follow-up visits as told by your health care provider. This is important.   Contact a health care provider if you:  Have new symptoms.  Cough up pus.  Have a cough that does not get better after 2-3 weeks or gets worse.  Cannot control your cough with cough suppressant medicines and you are losing sleep.  Have pain that gets worse or pain that is not helped with medicine.  Have a fever.  Have unexplained weight loss.  Have night sweats. Get help right away if:  You cough up blood.  You have difficulty breathing.  Your heartbeat is very fast. These symptoms may represent a serious problem that is an emergency. Do not wait to see if the symptoms will go away. Get medical help right away. Call your local emergency services (911 in the [place] Do not drive yourself to the  hospital. Summary  Coughing is a reflex that clears your throat and your airways. It is normal to cough occasionally, but a cough that happens with other symptoms or lasts a long time may be a sign of a condition that needs treatment.  Take over-the-counter and prescription medicines only as told by your  health care provider.  Always cover your mouth when you cough.  Contact a health care provider if you have new symptoms or a cough that does not get better after 2-3 weeks or gets worse. This information is not intended to replace advice given to you by your health care provider. Make sure you discuss any questions you have with your health care provider. Document Revised: 10/08/2018 Document Reviewed: 10/08/2018 Elsevier Patient Education  2021 [employer].

## 2020-10-28 NOTE — Progress Notes (Signed)
Established Patient Office Visit  Subjective:  Patient ID: Kelly Meyers, female    DOB: [birth year]  Age: 45 y.o. MRN: [id number]  CC:  Chief Complaint  Patient presents with  . Cough   Virtual Visit via Telephone Note  I connected with Kelly Meyers on 10/28/20 at  1:30 PM EST by telephone and verified that I am speaking with the correct person using two identifiers.  Location: Patient: Work Provider: Primary Care at [facility]   I discussed the limitations, risks, security and privacy concerns of performing an evaluation and management service by telephone and the availability of in person appointments. I also discussed with the patient that there may be a patient responsible charge related to this service. The patient expressed understanding and agreed to proceed.   History of Present Illness: Reports that she has been having a productive cough with yellow and white sputum since the beginning of January.  Reports her and her boyfriend both started having body aches, chills, sore throat and fevers (unmeasured) at the beginning of the year.  Did not reach out for Covid testing.  Reports that all of her upper respiratory symptoms have resolved with the exception of the productive cough.  Reports cough is keeping her awake at night.  Reports that she has been using her inhaler and nebulizer with some relief.  No previous Covid vaccines   Observations/Objective: Medical history and current medications reviewed, no physical exam completed        Past Medical History:  Diagnosis Date  . Allergy   . Anemia   . Asthma   . Hypertension     Past Surgical History:  Procedure Laterality Date  . CESAREAN SECTION      Family History  Problem Relation Age of Onset  . Diabetes Mother   . Multiple sclerosis Mother   . Hypertension Father   . Diabetes Father     Social History   Socioeconomic History  . Marital status: Single    Spouse name: Not on file  .  Number of children: Not on file  . Years of education: Not on file  . Highest education level: Not on file  Occupational History  . Not on file  Tobacco Use  . Smoking status: Never Smoker  . Smokeless tobacco: Never Used  Substance and Sexual Activity  . Alcohol use: Yes    Comment: occ  . Drug use: No  . Sexual activity: Yes    Birth control/protection: I.U.D.  Other Topics Concern  . Not on file  Social History Narrative  . Not on file   Social Determinants of Health   Financial Resource Strain: Not on file  Food Insecurity: Not on file  Transportation Needs: Not on file  Physical Activity: Not on file  Stress: Not on file  Social Connections: Not on file  Intimate Partner Violence: Not on file    Outpatient Medications Prior to Visit  Medication Sig Dispense Refill  . albuterol (PROAIR HFA) 108 (90 Base) MCG/ACT inhaler Inhale 1-2 puffs into the lungs every 6 (six) hours as needed for wheezing or shortness of breath. 8 g 0  . albuterol (PROVENTIL) (2.5 MG/3ML) 0.083% nebulizer solution Take 3 mLs (2.5 mg total) by nebulization every 6 (six) hours as needed for wheezing or shortness of breath. 75 mL 0  . amLODipine (NORVASC) 10 MG tablet Take 1 tablet (10 mg total) by mouth daily. 90 tablet 2  . Fluticasone Furoate (ARNUITY ELLIPTA) 100 MCG/ACT AEPB  Inhale 2 puffs into the lungs daily. 90 each 1  . promethazine-dextromethorphan (PROMETHAZINE-DM) 6.25-15 MG/5ML syrup Take 5 mLs by mouth 4 (four) times daily as needed for cough. (Patient not taking: Reported on 10/23/2020) 100 mL 0  . predniSONE (DELTASONE) 10 MG tablet Take 6 tabs day one, 5 tabs day two, 4 tabs day three, etc (Patient not taking: Reported on 10/23/2020) 21 tablet 0   No facility-administered medications prior to visit.    Allergies  Allergen Reactions  . Shellfish Allergy Anaphylaxis and Itching  . Onion Itching  . Oxycodone Itching    ROS Review of Systems  Constitutional: Negative for chills,  fatigue and fever.  HENT: Positive for congestion. Negative for sinus pressure, sinus pain, sore throat and trouble swallowing.   Eyes: Negative.   Respiratory: Positive for cough. Negative for shortness of breath and wheezing.   Cardiovascular: Negative for chest pain.  Gastrointestinal: Negative for abdominal pain, diarrhea, nausea and vomiting.  Endocrine: Negative.   Genitourinary: Negative.   Musculoskeletal: Negative for myalgias.  Skin: Negative.   Allergic/Immunologic: Negative.   Neurological: Positive for headaches.  Hematological: Negative.   Psychiatric/Behavioral: Negative.       Objective:     There were no vitals taken for this visit. Wt Readings from Last 3 Encounters:  10/23/20 194 lb (88 kg)  06/27/20 190 lb (86.2 kg)  02/21/20 198 lb 6.6 oz (90 kg)     Health Maintenance Due  Topic Date Due  . Hepatitis C Screening  Never done  . HIV Screening  Never done  . MAMMOGRAM  08/27/2020    There are no preventive care reminders to display for this patient.  Lab Results  Component Value Date   TSH 0.748 08/07/2018   Lab Results  Component Value Date   WBC 10.2 10/09/2019   HGB 14.6 10/09/2019   HCT 43.0 10/09/2019   MCV 90 10/09/2019   PLT 235 10/09/2019   Lab Results  Component Value Date   NA 140 10/09/2019   K 4.1 10/09/2019   CO2 23 10/09/2019   GLUCOSE 112 (H) 10/09/2019   BUN 5 (L) 10/09/2019   CREATININE 0.76 10/09/2019   BILITOT 0.4 10/09/2019   ALKPHOS 60 10/09/2019   AST 13 10/09/2019   ALT 12 10/09/2019   PROT 6.2 10/09/2019   ALBUMIN 4.2 10/09/2019   CALCIUM 9.0 10/09/2019   ANIONGAP 7 05/22/2017   Lab Results  Component Value Date   CHOL 94 (L) 10/09/2019   Lab Results  Component Value Date   HDL 36 (L) 10/09/2019   Lab Results  Component Value Date   LDLCALC 34 10/09/2019   Lab Results  Component Value Date   TRIG 140 10/09/2019   Lab Results  Component Value Date   CHOLHDL 2.6 10/09/2019   Lab Results   Component Value Date   HGBA1C 5.8 (H) 10/09/2019      Assessment & Plan:   Problem List Items Addressed This Visit      Respiratory   Mild persistent asthma without complication   Relevant Medications   predniSONE (DELTASONE) 20 MG tablet     Other   Productive cough - Primary   Relevant Medications   predniSONE (DELTASONE) 20 MG tablet      Assessment and Plan: 1. Productive cough Continue supportive treatment with inhaler and nebulizer, Mucinex over-the-counter.  Trial prednisone taper.  Increase hydration and rest.  Red flags given for prompt reevaluation.  Patient education given on importance and  efficacy of Covid vaccines. - predniSONE (DELTASONE) 20 MG tablet; Take 3 tablets (60 mg total) by mouth daily with breakfast for 2 days, THEN 2 tablets (40 mg total) daily with breakfast for 2 days, THEN 1 tablet (20 mg total) daily with breakfast for 2 days, THEN 0.5 tablets (10 mg total) daily with breakfast for 2 days.  Dispense: 13 tablet; Refill: 0  2. Mild persistent asthma without complication  - predniSONE (DELTASONE) 20 MG tablet; Take 3 tablets (60 mg total) by mouth daily with breakfast for 2 days, THEN 2 tablets (40 mg total) daily with breakfast for 2 days, THEN 1 tablet (20 mg total) daily with breakfast for 2 days, THEN 0.5 tablets (10 mg total) daily with breakfast for 2 days.  Dispense: 13 tablet; Refill: 0   Follow Up Instructions:    I discussed the assessment and treatment plan with the patient. The patient was provided an opportunity to ask questions and all were answered. The patient agreed with the plan and demonstrated an understanding of the instructions.   The patient was advised to call back or seek an in-person evaluation if the symptoms worsen or if the condition fails to improve as anticipated.  I provided 21 minutes of non-face-to-face time during this encounter.   Freddie Dymek S Melven Stockard, PA-C   Meds ordered this encounter  Medications  .  predniSONE (DELTASONE) 20 MG tablet    Sig: Take 3 tablets (60 mg total) by mouth daily with breakfast for 2 days, THEN 2 tablets (40 mg total) daily with breakfast for 2 days, THEN 1 tablet (20 mg total) daily with breakfast for 2 days, THEN 0.5 tablets (10 mg total) daily with breakfast for 2 days.    Dispense:  13 tablet    Refill:  0    Order Specific Question:   Supervising Provider    Answer:   Storm Frisk [[id number]]    Follow-up: Return if symptoms worsen or fail to improve.    Kasandra Knudsen Gregroy Dombkowski, PA-C

## 2021-04-27 ENCOUNTER — Other Ambulatory Visit: Payer: Self-pay

## 2021-04-27 ENCOUNTER — Ambulatory Visit (HOSPITAL_COMMUNITY)
Admission: EM | Admit: 2021-04-27 | Discharge: 2021-04-27 | Disposition: A | Discharge status: Home / Self Care | Payer: BC Managed Care – PPO | Attending: Internal Medicine | Admitting: Internal Medicine

## 2021-04-27 ENCOUNTER — Encounter (HOSPITAL_COMMUNITY): Payer: Self-pay

## 2021-04-27 DIAGNOSIS — J029 Acute pharyngitis, unspecified: Secondary | ICD-10-CM | POA: Diagnosis not present

## 2021-04-27 DIAGNOSIS — H6001 Abscess of right external ear: Secondary | ICD-10-CM | POA: Diagnosis not present

## 2021-04-27 DIAGNOSIS — J069 Acute upper respiratory infection, unspecified: Secondary | ICD-10-CM | POA: Diagnosis not present

## 2021-04-27 DIAGNOSIS — Z20822 Contact with and (suspected) exposure to covid-19: Secondary | ICD-10-CM | POA: Diagnosis not present

## 2021-04-27 LAB — SARS CORONAVIRUS 2 (TAT 6-24 HRS): SARS Coronavirus 2: NEGATIVE

## 2021-04-27 LAB — POCT RAPID STREP A, ED / UC: Streptococcus, Group A Screen (Direct): NEGATIVE

## 2021-04-27 MED ORDER — CEPHALEXIN 500 MG PO CAPS: 500.0000 mg | ORAL_CAPSULE | Freq: Four times a day (QID) | ORAL | 0 refills | Status: DC

## 2021-04-27 MED ORDER — MUPIROCIN CALCIUM 2 % EX CREA: 1.0000 "application " | TOPICAL_CREAM | Freq: Two times a day (BID) | CUTANEOUS | 0 refills | Status: DC

## 2021-04-27 NOTE — ED Triage Notes (Signed)
Pt presents with c/o loss of voice x 3 days.  States she sees a bump forming inside on the right side of her throat.

## 2021-04-27 NOTE — Discharge Instructions (Addendum)
You likely having a viral upper respiratory infection. We recommended symptom control. I expect your symptoms to start improving in the next 1-2 weeks.   1. Take a daily allergy pill/anti-histamine like Zyrtec, Claritin, or Store brand consistently for 2 weeks  2. For congestion you may try an oral decongestant like Mucinex or sudafed. You may also try intranasal flonase nasal spray or saline irrigations (neti pot, sinus cleanse)  3. For your sore throat you may try cepacol lozenges, salt water gargles, throat spray. Treatment of congestion may also help your sore throat.  4. For cough you may try Robitussen, Mucinex DM  5. Take Tylenol or Ibuprofen to help with pain/inflammation  6. Stay hydrated, drink plenty of fluids to keep throat coated and less irritated  Honey Tea For cough/sore throat try using a honey-based tea. Use 3 teaspoons of honey with juice squeezed from half lemon. Place shaved pieces of ginger into 1/2-1 cup of water and warm over stove top. Then mix the ingredients and repeat every 4 hours as needed.   Your rapid strep test was negative in urgent care today.  Throat culture is pending COVID-19 viral swab is pending.  We will call if these are positive.  You have a pimple like lesion in your right ear canal.  Please take prescribed antibiotic pill and antibiotic cream to help with this.

## 2021-04-27 NOTE — ED Provider Notes (Addendum)
[facility]    CSN: [id number] Arrival date & time: 04/27/21  0815      History   Chief Complaint Chief Complaint  Patient presents with   Sore Throat    HPI Kelly Meyers is a 45 y.o. female.   Patient presents with 4-day history of hoarseness, nasal congestion, and sore throat that started approximately 3 days ago.  Patient is also having bilateral ear pain.  Denies any known fevers at home.  Denies any known sick contacts.  Denies any shortness of breath or chest pain.   Sore Throat   Past Medical History:  Diagnosis Date   Allergy    Anemia    Asthma    Hypertension     Patient Active Problem List   Diagnosis Date Noted   Productive cough 10/28/2020   Mild persistent asthma without complication 10/28/2020   Recurrent boils 08/09/2018   ALLERGIC RHINITIS 12/26/2007   Asthma 12/26/2007    Past Surgical History:  Procedure Laterality Date   CESAREAN SECTION      OB History   No obstetric history on file.      Home Medications    Prior to Admission medications   Medication Sig Start Date End Date Taking? Authorizing Provider  cephALEXin (KEFLEX) 500 MG capsule Take 1 capsule (500 mg total) by mouth 4 (four) times daily. 04/27/21  Yes Lance Muss, FNP  mupirocin cream (BACTROBAN) 2 % Apply 1 application topically 2 (two) times daily. 04/27/21  Yes Lance Muss, FNP  albuterol (PROVENTIL) (2.5 MG/3ML) 0.083% nebulizer solution Take 3 mLs (2.5 mg total) by nebulization every 6 (six) hours as needed for wheezing or shortness of breath. 05/15/19   Wallis Bamberg, PA-C  albuterol (VENTOLIN HFA) 108 (90 Base) MCG/ACT inhaler INHALE 1 TO 2 PUFFS INTO THE LUNGS EVERY 6 HOURS AS NEEDED FOR WHEEZING OR SHORTNESS OF BREATH 10/29/20   Mayers, Cari S, PA-C  amLODipine (NORVASC) 10 MG tablet Take 1 tablet (10 mg total) by mouth daily. 10/09/19   Claiborne Rigg, NP  Fluticasone Furoate (ARNUITY ELLIPTA) 100 MCG/ACT AEPB Inhale 2 puffs into the lungs  daily. 03/05/20   Arvilla Market, MD  promethazine-dextromethorphan (PROMETHAZINE-DM) 6.25-15 MG/5ML syrup Take 5 mLs by mouth 4 (four) times daily as needed for cough. Patient not taking: Reported on 10/23/2020 06/27/20   Particia Nearing, PA-C    Family History Family History  Problem Relation Age of Onset   Diabetes Mother    Multiple sclerosis Mother    Hypertension Father    Diabetes Father     Social History Social History   Tobacco Use   Smoking status: Never   Smokeless tobacco: Never  Substance Use Topics   Alcohol use: Yes    Comment: occ   Drug use: No     Allergies   Shellfish allergy, Onion, and Oxycodone   Review of Systems Review of Systems Per HPI  Physical Exam Triage Vital Signs ED Triage Vitals  Enc Vitals Group     BP 04/27/21 0838 (!) 160/94     Pulse Rate 04/27/21 0838 85     Resp 04/27/21 0838 18     Temp 04/27/21 0838 98.3 F (36.8 C)     Temp Source 04/27/21 0838 Oral     SpO2 04/27/21 0838 99 %     Weight --      Height --      Head Circumference --      Peak  Flow --      Pain Score 04/27/21 0836 0     Pain Loc --      Pain Edu? --      Excl. in GC? --    No data found.  Updated Vital Signs BP (!) 160/94 (BP Location: Left Arm)   Pulse 85   Temp 98.3 F (36.8 C) (Oral)   Resp 18   LMP 03/26/2021 (Exact Date)   SpO2 99%   Visual Acuity Right Eye Distance:   Left Eye Distance:   Bilateral Distance:    Right Eye Near:   Left Eye Near:    Bilateral Near:     Physical Exam Constitutional:      General: She is not in acute distress.    Appearance: Normal appearance.  HENT:     Head: Normocephalic and atraumatic.     Right Ear: Tympanic membrane normal. Swelling and tenderness present. Tympanic membrane is not erythematous.     Left Ear: Tympanic membrane and ear canal normal.     Ears:     Comments: Pimple-like lesion present to opening of right external canal of ear    Nose: Congestion present.      Mouth/Throat:     Mouth: Mucous membranes are moist.     Pharynx: Posterior oropharyngeal erythema present.  Eyes:     Extraocular Movements: Extraocular movements intact.     Conjunctiva/sclera: Conjunctivae normal.     Pupils: Pupils are equal, round, and reactive to light.  Cardiovascular:     Rate and Rhythm: Normal rate and regular rhythm.     Pulses: Normal pulses.     Heart sounds: Normal heart sounds.  Pulmonary:     Effort: Pulmonary effort is normal. No respiratory distress.     Breath sounds: Normal breath sounds. No wheezing, rhonchi or rales.  Abdominal:     General: Abdomen is flat. Bowel sounds are normal.     Palpations: Abdomen is soft.  Musculoskeletal:        General: Normal range of motion.     Cervical back: Normal range of motion.  Skin:    General: Skin is warm and dry.  Neurological:     General: No focal deficit present.     Mental Status: She is alert and oriented to person, place, and time. Mental status is at baseline.  Psychiatric:        Mood and Affect: Mood normal.        Behavior: Behavior normal.     UC Treatments / Results  Labs (all labs ordered are listed, but only abnormal results are displayed) Labs Reviewed  CULTURE, GROUP A STREP ([facility])  SARS CORONAVIRUS 2 (TAT 6-24 HRS)  POCT RAPID STREP A, ED / UC    EKG   Radiology No results found.  Procedures Procedures (including critical care time)  Medications Ordered in UC Medications - No data to display  Initial Impression / Assessment and Plan / UC Course  I have reviewed the triage vital signs and the nursing notes.  Pertinent labs & imaging results that were available during my care of the patient were reviewed by me and considered in my medical decision making (see chart for details).     Patient presents with symptoms likely from a viral upper respiratory infection. Differential includes bacterial pneumonia, sinusitis, allergic rhinitis, Covid 19. Do not suspect  underlying cardiopulmonary process. Symptoms seem unlikely related to ACS, CHF or COPD exacerbations, pneumonia, pneumothorax. Patient is nontoxic appearing  and not in need of emergent medical intervention.  Recommended symptom control with over the counter medications: Daily oral anti-histamine, Oral decongestant or IN corticosteroid, saline irrigations, cepacol lozenges, Robitussin, Delsym, honey tea.  Recommend the patient avoid any medication that ends in D or DM as this may raise blood pressure.  Patient may take Mucinex or Coricidin HBP.  Rapid strep test was negative in urgent care today.  Throat culture and COVID-19 viral swab are pending.  Will treat pimple-like lesion to right external ear canal with cephalexin antibiotic x7 days and mupirocin antibiotic ointment.  Patient to use warm compresses to affected area as well.  Patient to follow-up with PCP or urgent care if symptoms do not resolve or improve.  Return if symptoms fail to improve in 1-2 weeks or you develop shortness of breath, chest pain, severe headache. Patient states understanding and is agreeable.  Discharged with PCP followup.  Final Clinical Impressions(s) / UC Diagnoses   Final diagnoses:  Sore throat  Encounter for laboratory testing for COVID-19 virus  Viral upper respiratory tract infection  Abscess of right ear canal     Discharge Instructions      You likely having a viral upper respiratory infection. We recommended symptom control. I expect your symptoms to start improving in the next 1-2 weeks.   1. Take a daily allergy pill/anti-histamine like Zyrtec, Claritin, or Store brand consistently for 2 weeks  2. For congestion you may try an oral decongestant like Mucinex or sudafed. You may also try intranasal flonase nasal spray or saline irrigations (neti pot, sinus cleanse)  3. For your sore throat you may try cepacol lozenges, salt water gargles, throat spray. Treatment of congestion may also help your  sore throat.  4. For cough you may try Robitussen, Mucinex DM  5. Take Tylenol or Ibuprofen to help with pain/inflammation  6. Stay hydrated, drink plenty of fluids to keep throat coated and less irritated  Honey Tea For cough/sore throat try using a honey-based tea. Use 3 teaspoons of honey with juice squeezed from half lemon. Place shaved pieces of ginger into 1/2-1 cup of water and warm over stove top. Then mix the ingredients and repeat every 4 hours as needed.   Your rapid strep test was negative in urgent care today.  Throat culture is pending COVID-19 viral swab is pending.  We will call if these are positive.  You have a pimple like lesion in your right ear canal.  Please take prescribed antibiotic pill and antibiotic cream to help with this.     ED Prescriptions     Medication Sig Dispense Auth. Provider   cephALEXin (KEFLEX) 500 MG capsule Take 1 capsule (500 mg total) by mouth 4 (four) times daily. 28 capsule Lance Muss, FNP   mupirocin cream (BACTROBAN) 2 % Apply 1 application topically 2 (two) times daily. 15 g Lance Muss, FNP      PDMP not reviewed this encounter.   Lance Muss, FNP 04/27/21 1026    Lance Muss, [place] 04/27/21 1026

## 2021-04-29 LAB — CULTURE, GROUP A STREP (THRC)

## 2021-05-07 ENCOUNTER — Ambulatory Visit (INDEPENDENT_AMBULATORY_CARE_PROVIDER_SITE_OTHER): Payer: BC Managed Care – PPO | Admitting: Family

## 2021-05-07 ENCOUNTER — Other Ambulatory Visit: Payer: Self-pay

## 2021-05-07 ENCOUNTER — Encounter: Payer: Self-pay | Admitting: Family

## 2021-05-07 ENCOUNTER — Telehealth: Payer: Self-pay | Admitting: *Deleted

## 2021-05-07 VITALS — BP 150/89 | HR 84 | Temp 97.5°F | Resp 16 | Ht 61.0 in | Wt 189.0 lb

## 2021-05-07 DIAGNOSIS — Z09 Encounter for follow-up examination after completed treatment for conditions other than malignant neoplasm: Secondary | ICD-10-CM | POA: Diagnosis not present

## 2021-05-07 DIAGNOSIS — J302 Other seasonal allergic rhinitis: Secondary | ICD-10-CM

## 2021-05-07 DIAGNOSIS — H6121 Impacted cerumen, right ear: Secondary | ICD-10-CM

## 2021-05-07 DIAGNOSIS — J069 Acute upper respiratory infection, unspecified: Secondary | ICD-10-CM

## 2021-05-07 DIAGNOSIS — R490 Dysphonia: Secondary | ICD-10-CM

## 2021-05-07 NOTE — Telephone Encounter (Signed)
Patient came in office to request FMLA paperwork to be completed. She left a copy of the paperwork from last year with this year's renewal. Paperwork was placed in Provider [place] bin to await distribution.

## 2021-05-07 NOTE — Progress Notes (Signed)
Hospital f/u   Concerns that with cough and hoarseness Paperwork Discomfort in both wrist. Left greater that right

## 2021-05-07 NOTE — Patient Instructions (Signed)
take medications as prescribed 2.  Reports new symptom to the clinic or local ER 3.  Follow-up as needed with primary care provider.

## 2021-05-07 NOTE — Progress Notes (Signed)
Kelly Meyers, is a 45 y.o. female  EXH:[id number]  FYB:[id number]  DOB - 10-Jan-1976  Subjective:  Chief Complaint and HPI: Kelly Meyers is a 45 y.o. female who presents to the clinic this morning for hospital follow-up.  Seen at the local urgent care on April 27, 2021 with hoarseness loss of voice.  Was diagnosed with a viral upper respiratory infection.  Was sent home follow-up with PCP office.  Patient says he is feeling much better, has intermittent dry cough, but her voice is back.  Would like FMLA papers signed for her work.  Forgot to take her blood pressure medication this morning.  Denies headache, fever, chest pain, shortness of breath, runny nose or any other symptom than above.    ED/Hospital notes reviewed.    ROS:   Constitutional:  No f/c, No night sweats, No unexplained weight loss. EENT:  No vision changes, No blurry vision, No hearing changes. No mouth, throat, or ear problems.  Respiratory: Reports intermittent dry cough, No SOB Cardiac: No CP, no palpitations Neuro: No headache, no dizziness, no motor weakness.  Endocrine:  No polydipsia. No polyuria.  Psych: Denies SI/HI  No problems updated.  ALLERGIES: Allergies  Allergen Reactions   Shellfish Allergy Anaphylaxis and Itching   Onion Itching   Oxycodone Itching    PAST MEDICAL HISTORY: Past Medical History:  Diagnosis Date   Allergy    Anemia    Asthma    Hypertension     MEDICATIONS AT HOME: Prior to Admission medications   Medication Sig Start Date End Date Taking? Authorizing Provider  albuterol (PROVENTIL) (2.5 MG/3ML) 0.083% nebulizer solution Take 3 mLs (2.5 mg total) by nebulization every 6 (six) hours as needed for wheezing or shortness of breath. 05/15/19  Yes Wallis Bamberg, PA-C  amLODipine (NORVASC) 10 MG tablet Take 1 tablet (10 mg total) by mouth daily. 10/09/19  Yes Claiborne Rigg, NP  Fluticasone Furoate (ARNUITY ELLIPTA) 100 MCG/ACT AEPB Inhale 2 puffs into the lungs daily.  03/05/20  Yes Arvilla Market, MD  albuterol (VENTOLIN HFA) 108 (90 Base) MCG/ACT inhaler INHALE 1 TO 2 PUFFS INTO THE LUNGS EVERY 6 HOURS AS NEEDED FOR WHEEZING OR SHORTNESS OF BREATH Patient not taking: Reported on 05/07/2021 10/29/20   Mayers, Cari S, PA-C  cephALEXin (KEFLEX) 500 MG capsule Take 1 capsule (500 mg total) by mouth 4 (four) times daily. Patient not taking: Reported on 05/07/2021 04/27/21   Lance Muss, FNP  mupirocin cream (BACTROBAN) 2 % Apply 1 application topically 2 (two) times daily. Patient not taking: Reported on 05/07/2021 04/27/21   Lance Muss, FNP  promethazine-dextromethorphan (PROMETHAZINE-DM) 6.25-15 MG/5ML syrup Take 5 mLs by mouth 4 (four) times daily as needed for cough. Patient not taking: No sig reported 06/27/20   Particia Nearing, PA-C     Objective:  EXAM:   Vitals:   05/07/21 1126  BP: (!) 150/89  Pulse: 84  Resp: 16  Temp: (!) 97.5 F (36.4 C)  SpO2: 96%  Weight: 189 lb (85.7 kg)  Height: 5\' 1"  (1.549 m)    General appearance : A&OX3. NAD. Non-toxic-appearing HEENT: Atraumatic and Normocephalic.  PERRLA. EOM intact.  TM clear to the left side, minimal earwax to the right side. Mouth-MMM, post pharynx WNL w/o erythema, No PND. Neck: supple, no JVD. No cervical lymphadenopathy. No thyromegaly Chest/Lungs:  Breathing-non-labored, Good air entry bilaterally, breath sounds normal without rales, rhonchi, or wheezing  CVS: S1 S2 regular, no murmurs, gallops, rubs  Neurology:  CN II-XII grossly intact, Non focal.   Psych:  TP linear. J/I WNL. Normal speech. Appropriate eye contact and affect.    Data Review Lab Results  Component Value Date   HGBA1C 5.8 (H) 10/09/2019   HGBA1C 6.0 (H) 08/07/2018     Assessment & Plan   1. Viral upper respiratory tract infection -Take Tylenol or ibuprofen as needed -Stay hydrated -Take all your medications as prescribed -Ports new or worsening symptoms to the clinic or local ER  2.  Hospital discharge follow-up -Take Tylenol or ibuprofen as needed -Take all the medications as prescribed  3.  Hypertension -Take all your medications as prescribed -Crease exercise -Eat healthy diet     Patient have been counseled extensively about nutrition and exercise  No follow-ups on file.  The patient was given clear instructions to go to ER or return to medical center if symptoms don't improve, worsen or new problems develop. The patient verbalized understanding. The patient was told to call to get lab results if they haven't heard anything in the next week.     Eleonore Chiquito, APRN, FNP-C [facility] [place], [place] [phone]   05/07/2021, 11:55 AM

## 2021-05-10 NOTE — Telephone Encounter (Signed)
Patient called today asking about paperwork. Informed patient provider is out of the office all week and forms usually take up to a week to be completed.

## 2021-05-11 ENCOUNTER — Other Ambulatory Visit: Payer: Self-pay

## 2021-05-11 ENCOUNTER — Ambulatory Visit: Payer: BC Managed Care – PPO | Admitting: Physician Assistant

## 2021-05-11 VITALS — BP 150/78 | HR 86 | Temp 98.6°F | Resp 12 | Ht 61.0 in | Wt 196.0 lb

## 2021-05-11 DIAGNOSIS — H6121 Impacted cerumen, right ear: Secondary | ICD-10-CM

## 2021-05-11 DIAGNOSIS — J4530 Mild persistent asthma, uncomplicated: Secondary | ICD-10-CM

## 2021-05-11 DIAGNOSIS — R490 Dysphonia: Secondary | ICD-10-CM | POA: Diagnosis not present

## 2021-05-11 DIAGNOSIS — J302 Other seasonal allergic rhinitis: Secondary | ICD-10-CM | POA: Diagnosis not present

## 2021-05-11 NOTE — Progress Notes (Signed)
Voice hoarsenes Been ongoing since July 24th May need work note

## 2021-05-11 NOTE — Patient Instructions (Signed)
I have started a referral for you to be seen by ENT for further evaluation.  I encourage you to take a daily allergy medication, you can use Claritin or Zyrtec, you will purchase these over-the-counter.  To help with your earwax buildup, I would encourage you to use DeBrox.  You can purchase this over the counter. Do not use Q-tips to clean your ears  Roney Jaffe, PA-C Physician Assistant [facility] [email or web address]   Hoarseness  Hoarseness, also called dysphonia, is any abnormal change in your voice that can make it difficult to speak. Your voice may sound raspy, breathy, orstrained. Hoarseness is caused by a problem with your vocal cords (vocal folds). These are two bands of tissue inside your voice box (larynx). When you speak, your vocal cords move back and forth to create sound. The surfaces of your vocal cords need to be smooth for your voice to sound clear.Swelling or lumps on your vocal cords can cause hoarseness. Common causes of vocal cord problems include: Infection in the nose, throat, and upper air passages (upper respiratory infection). A long-term cough. Straining or overusing your voice. Smoking, or exposure to secondhand smoke. Allergies. Medication side effects. Vocal cord growths. Vocal cord injuries. Stomach acids that move up in your throat and irritate your vocal cords (gastroesophageal reflux). Diseases that affect the nervous system, such as a stroke or Parkinson's disease. Follow these instructions at home: Watch your condition for any changes. To ease discomfort and protect your vocal cords: Rest your voice. Do not whisper. Whispering can cause muscle strain. Do not speak in a loud or harsh voice. Avoid coughing or clearing your throat. Do not use any products that contain nicotine or tobacco, such as cigarettes and e-cigarettes. If you need help quitting, ask your health care provider. Avoid  secondhand smoke. Do not eat foods that give you heartburn, such as spicy or acidic foods like hot peppers and orange juice. Heartburn can make gastroesophageal reflux worse. Do not drink beverages that contain caffeine (coffee, tea, or soft drinks) or alcohol (beer, wine, or liquor). Drink enough fluid to keep your urine pale yellow. Use a humidifier if the air in your home is dry. If recommended by your health care provider, schedule an appointment with a speech-language specialist. This specialist may give you methods to try thatcan help you avoid misusing your voice. Contact a health care provider if: You have hoarseness that lasts longer than 3 weeks. You almost lose or completely lose your voice for more than 3 days. You have pain when you swallow or try to talk. You feel a lump in your neck. Get help right away if: You have trouble swallowing. You feel like you are choking when you swallow. You cough up blood or vomit blood. You have trouble breathing. You choke, cannot swallow, or cannot breathe if you lie flat. You notice swelling or a rash on your body, face, or tongue. Summary Hoarseness, also called dysphonia, is any abnormal change in your voice that can make it difficult to speak. Your voice may sound raspy, breathy, or strained. Hoarseness is caused by a problem with your vocal cords (vocal folds). Do not speak in a loud or harsh voice, use nicotine or tobacco products, or eat foods that give you heartburn. If recommended by your health care provider, meet with a speech-language specialist. This information is not intended to replace advice given to you by your health care provider. Make sure you discuss any questions you have with  your healthcare provider. Document Revised: 08/21/2020 Document Reviewed: 08/21/2020 Elsevier Patient Education  2022 [employer] Inc.  Earwax Buildup, Adult The ears produce a substance called earwax that helps keep bacteria out of the ear and  protects the skin in the ear canal. Occasionally, earwax can build upin the ear and cause discomfort or hearing loss. What are the causes? This condition is caused by a buildup of earwax. Ear canals are self-cleaning. Ear wax is made in the outer part of the ear canal and generally falls out insmall amounts over time. When the self-cleaning mechanism is not working, earwax builds up and can cause decreased hearing and discomfort. Attempting to clean ears with cotton swabs can push the earwax deep into the ear canal and cause decreased hearing andpain. What increases the risk? This condition is more likely to develop in people who: Clean their ears often with cotton swabs. Pick at their ears. Use earplugs or in-ear headphones often, or wear hearing aids. The following factors may also make you more likely to develop this condition: Being female. Being of older age. Naturally producing more earwax. Having narrow ear canals. Having earwax that is overly thick or sticky. Having excess hair in the ear canal. Having eczema. Being dehydrated. What are the signs or symptoms? Symptoms of this condition include: Reduced or muffled hearing. A feeling of fullness in the ear or feeling that the ear is plugged. Fluid coming from the ear. Ear pain or an itchy ear. Ringing in the ear. Coughing. Balance problems. An obvious piece of earwax that can be seen inside the ear canal. How is this diagnosed? This condition may be diagnosed based on: Your symptoms. Your medical history. An ear exam. During the exam, your health care provider will look into your ear with an instrument called an otoscope. You may have tests, including a hearing test. How is this treated? This condition may be treated by: Using ear drops to soften the earwax. Having the earwax removed by a health care provider. The health care provider may: Flush the ear with water. Use an instrument that has a loop on the end  (curette). Use a suction device. Having surgery to remove the wax buildup. This may be done in severe cases. Follow these instructions at home:  Take over-the-counter and prescription medicines only as told by your health care provider. Do not put any objects, including cotton swabs, into your ear. You can clean the opening of your ear canal with a washcloth or facial tissue. Follow instructions from your health care provider about cleaning your ears. Do not overclean your ears. Drink enough fluid to keep your urine pale yellow. This will help to thin the earwax. Keep all follow-up visits as told. If earwax builds up in your ears often or if you use hearing aids, consider seeing your health care provider for routine, preventive ear cleanings. Ask your health care provider how often you should schedule your cleanings. If you have hearing aids, clean them according to instructions from the manufacturer and your health care provider. Contact a health care provider if: You have ear pain. You develop a fever. You have pus or other fluid coming from your ear. You have hearing loss. You have ringing in your ears that does not go away. You feel like the room is spinning (vertigo). Your symptoms do not improve with treatment. Get help right away if: You have bleeding from the affected ear. You have severe ear pain. Summary Earwax can build up in the  ear and cause discomfort or hearing loss. The most common symptoms of this condition include reduced or muffled hearing, a feeling of fullness in the ear, or feeling that the ear is plugged. This condition may be diagnosed based on your symptoms, your medical history, and an ear exam. This condition may be treated by using ear drops to soften the earwax or by having the earwax removed by a health care provider. Do not put any objects, including cotton swabs, into your ear. You can clean the opening of your ear canal with a washcloth or facial  tissue. This information is not intended to replace advice given to you by your health care provider. Make sure you discuss any questions you have with your healthcare provider. Document Revised: 01/07/2020 Document Reviewed: 01/07/2020 Elsevier Patient Education  2022 [employer].

## 2021-05-11 NOTE — Progress Notes (Signed)
Established Patient Office Visit  Subjective:  Patient ID: Kelly Meyers, female    DOB: 05-21-1976  Age: 45 y.o. MRN: [id number]  CC:  Chief Complaint  Patient presents with   Hoarse    HPI Kelly Meyers reports that she continues to deal with an upper respiratory infection, states that she has been experiencing hoarseness since April 25, 2021.  Reports that she was initially seen at urgent care on April 27, 2021 and was encouraged to continue supportive care.  Rapid strep was negative, throat culture and COVID test were negative as well.  Reports that she was seen once again on August 5 with same complaint and once again was encouraged to continue supportive care.  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  Reports that she has a constant feeling of pressure on the back of her throat.  Reports that she is continuing to use Tylenol, tea with honey, throat lozenges without much relief.   Reports that she had a bump on the inside of her right ear that was evaluated when she was at urgent care, states that a antibiotic drop was prescribed but she has not been able to pick this up.  Reports that she does not feel the bump anymore but does feel her ear is "clogged".  Past Medical History:  Diagnosis Date   Allergy    Anemia    Asthma    Hypertension     Past Surgical History:  Procedure Laterality Date   CESAREAN SECTION      Family History  Problem Relation Age of Onset   Diabetes Mother    Multiple sclerosis Mother    Hypertension Father    Diabetes Father     Social History   Socioeconomic History   Marital status: Single    Spouse name: Not on file   Number of children: Not on file   Years of education: Not on file   Highest education level: Not on file  Occupational History   Not on file  Tobacco Use   Smoking status: Never   Smokeless tobacco: Never  Substance and Sexual  Activity   Alcohol use: Yes    Comment: occ   Drug use: No   Sexual activity: Yes    Birth control/protection: I.U.D.  Other Topics Concern   Not on file  Social History Narrative   Not on file   Social Determinants of Health   Financial Resource Strain: Not on file  Food Insecurity: Not on file  Transportation Needs: Not on file  Physical Activity: Not on file  Stress: Not on file  Social Connections: Not on file  Intimate Partner Violence: Not on file    Outpatient Medications Prior to Visit  Medication Sig Dispense Refill   albuterol (PROVENTIL) (2.5 MG/3ML) 0.083% nebulizer solution Take 3 mLs (2.5 mg total) by nebulization every 6 (six) hours as needed for wheezing or shortness of breath. 75 mL 0   albuterol (VENTOLIN HFA) 108 (90 Base) MCG/ACT inhaler INHALE 1 TO 2 PUFFS INTO THE LUNGS EVERY 6 HOURS AS NEEDED FOR WHEEZING OR SHORTNESS OF BREATH 6.7 g 1   amLODipine (NORVASC) 10 MG tablet Take 1 tablet (10 mg total) by mouth daily. 90 tablet 2   cephALEXin (KEFLEX) 500 MG capsule Take 1 capsule (500 mg total) by mouth 4 (four) times daily. 28 capsule 0   Fluticasone Furoate (ARNUITY  ELLIPTA) 100 MCG/ACT AEPB Inhale 2 puffs into the lungs daily. 90 each 1   mupirocin cream (BACTROBAN) 2 % Apply 1 application topically 2 (two) times daily. 15 g 0   promethazine-dextromethorphan (PROMETHAZINE-DM) 6.25-15 MG/5ML syrup Take 5 mLs by mouth 4 (four) times daily as needed for cough. (Patient not taking: Reported on 05/11/2021) 100 mL 0   No facility-administered medications prior to visit.    Allergies  Allergen Reactions   Shellfish Allergy Anaphylaxis and Itching   Onion Itching   Oxycodone Itching    ROS Review of Systems  Constitutional:  Negative for chills and fever.  HENT:  Positive for congestion and voice change. Negative for ear discharge, ear pain, sinus pressure and sore throat.   Eyes: Negative.   Respiratory:  Positive for cough. Negative for shortness of  breath.   Cardiovascular:  Negative for chest pain.  Gastrointestinal:  Negative for nausea and vomiting.  Endocrine: Negative.   Genitourinary: Negative.   Musculoskeletal: Negative.   Allergic/Immunologic: Negative.   Neurological: Negative.   Hematological: Negative.   Psychiatric/Behavioral: Negative.       Objective:    Physical Exam Vitals and nursing note reviewed.  Constitutional:      Appearance: Normal appearance.  HENT:     Head: Normocephalic and atraumatic.     Right Ear: There is impacted cerumen.     Left Ear: Tympanic membrane, ear canal and external ear normal.     Nose:     Right Turbinates: Enlarged.     Left Turbinates: Enlarged.     Mouth/Throat:     Lips: Pink.     Mouth: Mucous membranes are moist.     Pharynx: Oropharynx is clear.     Tonsils: No tonsillar exudate.  Eyes:     Extraocular Movements: Extraocular movements intact.     Conjunctiva/sclera: Conjunctivae normal.     Pupils: Pupils are equal, round, and reactive to light.  Cardiovascular:     Rate and Rhythm: Regular rhythm.     Pulses: Normal pulses.     Heart sounds: Normal heart sounds.  Pulmonary:     Effort: Pulmonary effort is normal.     Breath sounds: Normal breath sounds. No wheezing.  Musculoskeletal:        General: Normal range of motion.     Cervical back: Normal range of motion and neck supple.  Lymphadenopathy:     Cervical: No cervical adenopathy.  Skin:    General: Skin is warm and dry.  Neurological:     General: No focal deficit present.     Mental Status: She is alert and oriented to person, place, and time.  Psychiatric:        Mood and Affect: Mood normal.        Behavior: Behavior normal.        Thought Content: Thought content normal.        Judgment: Judgment normal.    BP (!) 150/78 (BP Location: Left Arm, Patient Position: Sitting, Cuff Size: Large)   Pulse 86   Temp 98.6 F (37 C) (Oral)   Resp 12   Ht 5\' 1"  (1.549 m)   Wt 196 lb (88.9 kg)    LMP 05/11/2021   SpO2 100%   BMI 37.03 kg/m  Wt Readings from Last 3 Encounters:  05/11/21 196 lb (88.9 kg)  05/07/21 189 lb (85.7 kg)  10/23/20 194 lb (88 kg)     Health Maintenance Due  Topic Date Due  COVID-19 Vaccine (1) Never done   HIV Screening  Never done   Hepatitis C Screening  Never done   MAMMOGRAM  08/27/2020   PAP SMEAR-Modifier  03/07/2021   INFLUENZA VACCINE  05/03/2021    There are no preventive care reminders to display for this patient.  Lab Results  Component Value Date   TSH 0.748 08/07/2018   Lab Results  Component Value Date   WBC 10.2 10/09/2019   HGB 14.6 10/09/2019   HCT 43.0 10/09/2019   MCV 90 10/09/2019   PLT 235 10/09/2019   Lab Results  Component Value Date   NA 140 10/09/2019   K 4.1 10/09/2019   CO2 23 10/09/2019   GLUCOSE 112 (H) 10/09/2019   BUN 5 (L) 10/09/2019   CREATININE 0.76 10/09/2019   BILITOT 0.4 10/09/2019   ALKPHOS 60 10/09/2019   AST 13 10/09/2019   ALT 12 10/09/2019   PROT 6.2 10/09/2019   ALBUMIN 4.2 10/09/2019   CALCIUM 9.0 10/09/2019   ANIONGAP 7 05/22/2017   Lab Results  Component Value Date   CHOL 94 (L) 10/09/2019   Lab Results  Component Value Date   HDL 36 (L) 10/09/2019   Lab Results  Component Value Date   LDLCALC 34 10/09/2019   Lab Results  Component Value Date   TRIG 140 10/09/2019   Lab Results  Component Value Date   CHOLHDL 2.6 10/09/2019   Lab Results  Component Value Date   HGBA1C 5.8 (H) 10/09/2019      Assessment & Plan:   Problem List Items Addressed This Visit   None Visit Diagnoses     Hoarseness    -  Primary   Relevant Orders   Ambulatory referral to ENT   Impacted cerumen of right ear       Seasonal allergies           No orders of the defined types were placed in this encounter. 1. Hoarseness Patient encouraged to do trial of Zyrtec over-the-counter, continue supportive care, refer to ENT for further evaluation.  Encouraged increase hydration and  rest. - Ambulatory referral to ENT  2. Impacted cerumen of right ear Patient encouraged to use Debrox at home, patient education given on supportive care  3. Seasonal allergies    I have reviewed the patient's medical history (PMH, PSH, Social History, Family History, Medications, and allergies) , and have been updated if relevant. I spent 30 minutes reviewing chart and  face to face time with patient.     Follow-up: Return if symptoms worsen or fail to improve.    Kasandra Knudsen Mayers, PA-C

## 2021-05-15 NOTE — Telephone Encounter (Signed)
I am unfamiliar with patient as I do not have any past encounters with patient. Patient's most recent primary provider being Marcy Siren, DO.  I am unsure as to what the exact reason patient is requesting FMLA. I am  also unsure of the request of completion of FMLA paperwork by me.   Upon review patient most recently seen by Eleonore Chiquito, NP on 05/07/2021 and again by Maurene Capes, PA on 05/11/2021. Either may be better able to assist patient with completion of FMLA paperwork.   If not,  if someone can please give me supporting history of the aforementioned matters and/or patient requests it will be greatly appreciated so that I can assist the patient.

## 2021-05-17 ENCOUNTER — Ambulatory Visit: Payer: BC Managed Care – PPO | Admitting: Family Medicine

## 2021-05-19 ENCOUNTER — Ambulatory Visit (INDEPENDENT_AMBULATORY_CARE_PROVIDER_SITE_OTHER): Payer: BC Managed Care – PPO | Admitting: Family Medicine

## 2021-05-19 ENCOUNTER — Encounter: Payer: Self-pay | Admitting: Family Medicine

## 2021-05-19 ENCOUNTER — Other Ambulatory Visit: Payer: Self-pay

## 2021-05-19 VITALS — BP 155/92 | HR 85 | Resp 16 | Wt 190.0 lb

## 2021-05-19 DIAGNOSIS — Z6835 Body mass index (BMI) 35.0-35.9, adult: Secondary | ICD-10-CM | POA: Diagnosis not present

## 2021-05-19 DIAGNOSIS — I10 Essential (primary) hypertension: Secondary | ICD-10-CM | POA: Diagnosis not present

## 2021-05-19 DIAGNOSIS — J4530 Mild persistent asthma, uncomplicated: Secondary | ICD-10-CM | POA: Diagnosis not present

## 2021-05-19 DIAGNOSIS — E6609 Other obesity due to excess calories: Secondary | ICD-10-CM

## 2021-05-19 MED ORDER — HYDROCHLOROTHIAZIDE 25 MG PO TABS: 25.0000 mg | ORAL_TABLET | Freq: Every day | ORAL | 0 refills | Status: DC

## 2021-05-19 NOTE — Progress Notes (Signed)
Establish care

## 2021-05-19 NOTE — Progress Notes (Signed)
Established Patient Office Visit  Subjective:  Patient ID: Kelly Meyers, female    DOB: 1976/09/07  Age: 45 y.o. MRN: [id number]  CC: No chief complaint on file.   HPI Kelly Meyers presents for follow-up of asthma.  She reports that presently she has no acute symptoms.  She is requesting an FMLA form to be completed for intermittent exacerbations.  Patient also was concerned about her increasing weight.  She would like to discuss options for losing weight.  Past Medical History:  Diagnosis Date   Allergy    Anemia    Asthma    Hypertension     Past Surgical History:  Procedure Laterality Date   CESAREAN SECTION      Family History  Problem Relation Age of Onset   Diabetes Mother    Multiple sclerosis Mother    Hypertension Father    Diabetes Father     Social History   Socioeconomic History   Marital status: Single    Spouse name: Not on file   Number of children: Not on file   Years of education: Not on file   Highest education level: Not on file  Occupational History   Not on file  Tobacco Use   Smoking status: Never   Smokeless tobacco: Never  Substance and Sexual Activity   Alcohol use: Yes    Comment: occ   Drug use: No   Sexual activity: Yes    Birth control/protection: I.U.D.  Other Topics Concern   Not on file  Social History Narrative   Not on file   Social Determinants of Health   Financial Resource Strain: Not on file  Food Insecurity: Not on file  Transportation Needs: Not on file  Physical Activity: Not on file  Stress: Not on file  Social Connections: Not on file  Intimate Partner Violence: Not on file    Outpatient Medications Prior to Visit  Medication Sig Dispense Refill   albuterol (PROVENTIL) (2.5 MG/3ML) 0.083% nebulizer solution Take 3 mLs (2.5 mg total) by nebulization every 6 (six) hours as needed for wheezing or shortness of breath. 75 mL 0   albuterol (VENTOLIN HFA) 108 (90 Base) MCG/ACT inhaler INHALE 1 TO  2 PUFFS INTO THE LUNGS EVERY 6 HOURS AS NEEDED FOR WHEEZING OR SHORTNESS OF BREATH 6.7 g 1   amLODipine (NORVASC) 10 MG tablet Take 1 tablet (10 mg total) by mouth daily. 90 tablet 2   Fluticasone Furoate (ARNUITY ELLIPTA) 100 MCG/ACT AEPB Inhale 2 puffs into the lungs daily. 90 each 1   cephALEXin (KEFLEX) 500 MG capsule Take 1 capsule (500 mg total) by mouth 4 (four) times daily. (Patient not taking: Reported on 05/19/2021) 28 capsule 0   No facility-administered medications prior to visit.    Allergies  Allergen Reactions   Shellfish Allergy Anaphylaxis and Itching   Onion Itching   Oxycodone Itching    ROS Review of Systems    Objective:    Physical Exam  BP (!) 155/92 (BP Location: Right Arm, Patient Position: Sitting, Cuff Size: Large)   Pulse 85   Resp 16   Wt 190 lb (86.2 kg)   LMP 05/11/2021 (Exact Date)   SpO2 97%   BMI 35.90 kg/m  Wt Readings from Last 3 Encounters:  05/19/21 190 lb (86.2 kg)  05/11/21 196 lb (88.9 kg)  05/07/21 189 lb (85.7 kg)     Health Maintenance Due  Topic Date Due   COVID-19 Vaccine (1) Never done  HIV Screening  Never done   Hepatitis C Screening  Never done   MAMMOGRAM  08/27/2020   PAP SMEAR-Modifier  03/07/2021   INFLUENZA VACCINE  05/03/2021    There are no preventive care reminders to display for this patient.     Assessment & Plan:   1. Mild persistent asthma without complication Meds refilled.  Discussed compliance with regimen.  FMLA paperwork completed for intermittent exacerbations.  2. Essential hypertension Patient with elevated readings.  Monitoring labs drawn.  Hydrochlorothiazide 25 mg daily added to regimen.  Will monitor.  - hydrochlorothiazide (HYDRODIURIL) 25 MG tablet; Take 1 tablet (25 mg total) by mouth daily.  Dispense: 30 tablet; Refill: 0 - CMP14+EGFR - Lipid Panel  3. Class 2 obesity due to excess calories without serious comorbidity with body mass index (BMI) of 35.0 to 35.9 in  adult Discussed dietary and activity options in detail.  Goal for patient is 3 to 5 pounds per month weight loss.  Will monitor.  No orders of the defined types were placed in this encounter.   Follow-up: Return in about 4 weeks (around 06/16/2021) for follow up.    Becky Sax, MD

## 2021-05-20 LAB — CMP14+EGFR
ALT: 10 IU/L (ref 0–32)
AST: 11 IU/L (ref 0–40)
Albumin/Globulin Ratio: 1.8 (ref 1.2–2.2)
Albumin: 4.2 g/dL (ref 3.8–4.8)
Alkaline Phosphatase: 59 IU/L (ref 44–121)
BUN/Creatinine Ratio: 10 (ref 9–23)
BUN: 8 mg/dL (ref 6–24)
Bilirubin Total: 0.3 mg/dL (ref 0.0–1.2)
CO2: 26 mmol/L (ref 20–29)
Calcium: 9.2 mg/dL (ref 8.7–10.2)
Chloride: 103 mmol/L (ref 96–106)
Creatinine, Ser: 0.82 mg/dL (ref 0.57–1.00)
Globulin, Total: 2.3 g/dL (ref 1.5–4.5)
Glucose: 92 mg/dL (ref 65–99)
Potassium: 4.1 mmol/L (ref 3.5–5.2)
Sodium: 140 mmol/L (ref 134–144)
Total Protein: 6.5 g/dL (ref 6.0–8.5)
eGFR: 91 mL/min/{1.73_m2} (ref >59)

## 2021-05-20 LAB — LIPID PANEL
Chol/HDL Ratio: 2.9 ratio (ref 0.0–4.4)
Cholesterol, Total: 112 mg/dL (ref 100–199)
HDL: 38 mg/dL — ABNORMAL LOW (ref >39)
LDL Chol Calc (NIH): 43 mg/dL (ref 0–99)
Triglycerides: 194 mg/dL — ABNORMAL HIGH (ref 0–149)
VLDL Cholesterol Cal: 31 mg/dL (ref 5–40)

## 2021-06-21 ENCOUNTER — Ambulatory Visit (INDEPENDENT_AMBULATORY_CARE_PROVIDER_SITE_OTHER): Payer: BC Managed Care – PPO | Admitting: Otolaryngology

## 2021-06-25 ENCOUNTER — Ambulatory Visit: Payer: BC Managed Care – PPO | Admitting: Family Medicine

## 2021-07-04 ENCOUNTER — Ambulatory Visit (HOSPITAL_COMMUNITY)
Admission: EM | Admit: 2021-07-04 | Discharge: 2021-07-04 | Disposition: A | Discharge status: Still In Hospital | Payer: BC Managed Care – PPO | Attending: Physician Assistant | Admitting: Physician Assistant

## 2021-07-04 ENCOUNTER — Other Ambulatory Visit: Payer: Self-pay

## 2021-07-04 ENCOUNTER — Emergency Department (HOSPITAL_COMMUNITY)
Admission: EM | Admit: 2021-07-04 | Discharge: 2021-07-04 | Disposition: A | Discharge status: Still In Hospital | Payer: BC Managed Care – PPO | Source: Home / Self Care

## 2021-07-04 ENCOUNTER — Encounter (HOSPITAL_COMMUNITY): Payer: Self-pay | Admitting: Emergency Medicine

## 2021-07-04 DIAGNOSIS — Z1152 Encounter for screening for COVID-19: Secondary | ICD-10-CM | POA: Insufficient documentation

## 2021-07-04 DIAGNOSIS — J4521 Mild intermittent asthma with (acute) exacerbation: Secondary | ICD-10-CM | POA: Insufficient documentation

## 2021-07-04 LAB — SARS CORONAVIRUS 2 (TAT 6-24 HRS): SARS Coronavirus 2: NEGATIVE

## 2021-07-04 MED ORDER — PREDNISONE 20 MG PO TABS: 40.0000 mg | ORAL_TABLET | Freq: Every day | ORAL | 0 refills | Status: AC

## 2021-07-04 NOTE — Discharge Instructions (Addendum)
Take steroids as prescribed. Follow up with any further concerns.

## 2021-07-04 NOTE — ED Triage Notes (Signed)
Pt presents with cough and sob xs 4 days. States has used inhaler and treatments with some relief.

## 2021-07-04 NOTE — ED Provider Notes (Signed)
[facility]    CSN: [id number] Arrival date & time: 07/04/21  1348      History   Chief Complaint Chief Complaint  Patient presents with   Cough   Shortness of Breath    HPI Kelly Meyers is a 45 y.o. female.   Patient here today for evaluation of cough, wheezing, shortness of breath this been ongoing for the last 4 days.  She has not had fever or chills.  She denies any ear pain.  She has not had sore throat.  She denies any nausea, vomiting, abdominal pain.  She has tried using her inhaler without resolution of symptoms.  The history is provided by the patient.  Cough Associated symptoms: shortness of breath and wheezing   Associated symptoms: no chills, no ear pain, no eye discharge, no fever and no sore throat   Shortness of Breath Associated symptoms: cough and wheezing   Associated symptoms: no abdominal pain, no ear pain, no fever, no sore throat and no vomiting    Past Medical History:  Diagnosis Date   Allergy    Anemia    Asthma    Hypertension     Patient Active Problem List   Diagnosis Date Noted   Productive cough 10/28/2020   Mild persistent asthma without complication 10/28/2020   Recurrent boils 08/09/2018   ALLERGIC RHINITIS 12/26/2007   Asthma 12/26/2007    Past Surgical History:  Procedure Laterality Date   CESAREAN SECTION      OB History   No obstetric history on file.      Home Medications    Prior to Admission medications   Medication Sig Start Date End Date Taking? Authorizing Provider  predniSONE (DELTASONE) 20 MG tablet Take 2 tablets (40 mg total) by mouth daily with breakfast for 5 days. 07/04/21 07/09/21 Yes Tomi Bamberger, PA-C  albuterol (PROVENTIL) (2.5 MG/3ML) 0.083% nebulizer solution Take 3 mLs (2.5 mg total) by nebulization every 6 (six) hours as needed for wheezing or shortness of breath. 05/15/19   Wallis Bamberg, PA-C  albuterol (VENTOLIN HFA) 108 (90 Base) MCG/ACT inhaler INHALE 1 TO 2 PUFFS INTO THE  LUNGS EVERY 6 HOURS AS NEEDED FOR WHEEZING OR SHORTNESS OF BREATH 10/29/20   Mayers, Cari S, PA-C  amLODipine (NORVASC) 10 MG tablet Take 1 tablet (10 mg total) by mouth daily. 10/09/19   Claiborne Rigg, NP  Fluticasone Furoate (ARNUITY ELLIPTA) 100 MCG/ACT AEPB Inhale 2 puffs into the lungs daily. 03/05/20   Arvilla Market, MD  hydrochlorothiazide (HYDRODIURIL) 25 MG tablet Take 1 tablet (25 mg total) by mouth daily. 05/19/21   Georganna Skeans, MD    Family History Family History  Problem Relation Age of Onset   Diabetes Mother    Multiple sclerosis Mother    Hypertension Father    Diabetes Father     Social History Social History   Tobacco Use   Smoking status: Never   Smokeless tobacco: Never  Substance Use Topics   Alcohol use: Yes    Comment: occ   Drug use: No     Allergies   Shellfish allergy, Onion, and Oxycodone   Review of Systems Review of Systems  Constitutional:  Negative for chills and fever.  HENT:  Positive for congestion. Negative for ear pain, sinus pressure and sore throat.   Eyes:  Negative for discharge and redness.  Respiratory:  Positive for cough, shortness of breath and wheezing.   Gastrointestinal:  Negative for abdominal pain, diarrhea,  nausea and vomiting.    Physical Exam Triage Vital Signs ED Triage Vitals  Enc Vitals Group     BP 07/04/21 1508 (!) 163/106     Pulse Rate 07/04/21 1508 78     Resp 07/04/21 1508 18     Temp 07/04/21 1508 98.5 F (36.9 C)     Temp Source 07/04/21 1508 Oral     SpO2 07/04/21 1508 98 %     Weight --      Height --      Head Circumference --      Peak Flow --      Pain Score 07/04/21 1506 3     Pain Loc --      Pain Edu? --      Excl. in [place]? --    No data found.  Updated Vital Signs BP (!) 163/106 (BP Location: Right Arm)   Pulse 78   Temp 98.5 F (36.9 C) (Oral)   Resp 18   LMP 07/03/2021   SpO2 98%      Physical Exam Vitals and nursing note reviewed.  Constitutional:       General: She is not in acute distress.    Appearance: Normal appearance. She is not ill-appearing.  HENT:     Head: Normocephalic and atraumatic.     Ears:     Comments: Unable to visualize TMs due to cerumen in EACs    Nose: Congestion present.     Mouth/Throat:     Mouth: Mucous membranes are moist.     Pharynx: No oropharyngeal exudate or posterior oropharyngeal erythema.  Eyes:     Conjunctiva/sclera: Conjunctivae normal.  Cardiovascular:     Rate and Rhythm: Normal rate and regular rhythm.     Heart sounds: Normal heart sounds. No murmur heard. Pulmonary:     Effort: Pulmonary effort is normal. No respiratory distress.     Breath sounds: Wheezing (scattered diffuse wheezes) present. No rhonchi or rales.  Skin:    General: Skin is warm and dry.  Neurological:     Mental Status: She is alert.  Psychiatric:        Mood and Affect: Mood normal.        Thought Content: Thought content normal.     UC Treatments / Results  Labs (all labs ordered are listed, but only abnormal results are displayed) Labs Reviewed  SARS CORONAVIRUS 2 (TAT 6-24 HRS)    EKG   Radiology No results found.  Procedures Procedures (including critical care time)  Medications Ordered in UC Medications - No data to display  Initial Impression / Assessment and Plan / UC Course  I have reviewed the triage vital signs and the nursing notes.  Pertinent labs & imaging results that were available during my care of the patient were reviewed by me and considered in my medical decision making (see chart for details).  Suspect asthma exacerbation.  COVID screening ordered.  Will treat with short course of steroids.  Blood pressure significantly elevated in office, but patient was coughing throughout most of exam and I suspect this is the cause of her blood pressure being elevated.  She is stable otherwise.  Encouraged follow-up if no improvement with treatment or if symptoms worsen anyway.   Final  Clinical Impressions(s) / UC Diagnoses   Final diagnoses:  Mild intermittent asthma with exacerbation  Encounter for screening for COVID-19     Discharge Instructions      Take steroids as prescribed. Follow up  with any further concerns.      ED Prescriptions     Medication Sig Dispense Auth. Provider   predniSONE (DELTASONE) 20 MG tablet Take 2 tablets (40 mg total) by mouth daily with breakfast for 5 days. 10 tablet Tomi Bamberger, PA-C      PDMP not reviewed this encounter.   Tomi Bamberger, PA-C 07/04/21 1537

## 2021-07-16 ENCOUNTER — Ambulatory Visit (INDEPENDENT_AMBULATORY_CARE_PROVIDER_SITE_OTHER): Payer: BC Managed Care – PPO | Admitting: Family Medicine

## 2021-07-16 ENCOUNTER — Other Ambulatory Visit: Payer: Self-pay

## 2021-07-16 ENCOUNTER — Encounter: Payer: Self-pay | Admitting: Family Medicine

## 2021-07-16 VITALS — BP 147/86 | HR 85 | Temp 98.3°F | Resp 16 | Wt 181.0 lb

## 2021-07-16 DIAGNOSIS — J4530 Mild persistent asthma, uncomplicated: Secondary | ICD-10-CM

## 2021-07-16 DIAGNOSIS — I10 Essential (primary) hypertension: Secondary | ICD-10-CM

## 2021-07-16 DIAGNOSIS — J309 Allergic rhinitis, unspecified: Secondary | ICD-10-CM

## 2021-07-16 MED ORDER — FLUTICASONE PROPIONATE 50 MCG/ACT NA SUSP: 2.0000 | Freq: Every day | NASAL | 6 refills | Status: DC

## 2021-07-16 MED ORDER — ALBUTEROL SULFATE (2.5 MG/3ML) 0.083% IN NEBU: 2.5000 mg | INHALATION_SOLUTION | Freq: Four times a day (QID) | RESPIRATORY_TRACT | 0 refills | Status: DC | PRN

## 2021-07-16 MED ORDER — HYDROCHLOROTHIAZIDE 25 MG PO TABS: 25.0000 mg | ORAL_TABLET | Freq: Every day | ORAL | 0 refills | Status: DC

## 2021-07-16 MED ORDER — CETIRIZINE HCL 10 MG PO TABS: 10.0000 mg | ORAL_TABLET | Freq: Every day | ORAL | 5 refills | Status: DC

## 2021-07-16 MED ORDER — LOSARTAN POTASSIUM 50 MG PO TABS: 50.0000 mg | ORAL_TABLET | Freq: Every day | ORAL | 0 refills | Status: DC

## 2021-07-16 NOTE — Progress Notes (Signed)
Patient has had a cough x 2wks that is not going away. Patient is here for a follow- up visit for BP

## 2021-07-16 NOTE — Progress Notes (Signed)
Established  Patient Office Visit  Subjective:  Patient ID: Kelly Meyers, female    DOB: 20-Apr-1976  Age: 45 y.o. MRN: [id number]  CC:  Chief Complaint  Patient presents with   Cough   Hypertension    HPI Kelly Meyers presents for follow up of hypertension.  Patient also complains of persistent cough more prominent at nighttime.  She recently been seen at urgent care and was placed on steroids.  Past Medical History:  Diagnosis Date   Allergy    Anemia    Asthma    Hypertension     Past Surgical History:  Procedure Laterality Date   CESAREAN SECTION      Family History  Problem Relation Age of Onset   Diabetes Mother    Multiple sclerosis Mother    Hypertension Father    Diabetes Father     Social History   Socioeconomic History   Marital status: Single    Spouse name: Not on file   Number of children: Not on file   Years of education: Not on file   Highest education level: Not on file  Occupational History   Not on file  Tobacco Use   Smoking status: Never   Smokeless tobacco: Never  Substance and Sexual Activity   Alcohol use: Yes    Comment: occ   Drug use: No   Sexual activity: Yes    Birth control/protection: I.U.D.  Other Topics Concern   Not on file  Social History Narrative   Not on file   Social Determinants of Health   Financial Resource Strain: Not on file  Food Insecurity: Not on file  Transportation Needs: Not on file  Physical Activity: Not on file  Stress: Not on file  Social Connections: Not on file  Intimate Partner Violence: Not on file    ROS Review of Systems  Respiratory:  Positive for cough. Negative for wheezing.   Cardiovascular: Negative.   All other systems reviewed and are negative.  Objective:   Today's Vitals: BP (!) 147/86   Pulse 85   Temp 98.3 F (36.8 C) (Oral)   Resp 16   Wt 181 lb (82.1 kg)   LMP 07/03/2021   BMI 34.20 kg/m   Physical Exam Vitals and nursing note reviewed.   Constitutional:      General: She is not in acute distress. Cardiovascular:     Rate and Rhythm: Normal rate and regular rhythm.  Pulmonary:     Effort: Pulmonary effort is normal.     Breath sounds: Normal breath sounds. No wheezing.  Abdominal:     Palpations: Abdomen is soft.     Tenderness: There is no abdominal tenderness.  Musculoskeletal:     Right lower leg: No edema.     Left lower leg: No edema.  Neurological:     General: No focal deficit present.     Mental Status: She is alert and oriented to person, place, and time.    Assessment & Plan:    1. Essential hypertension Improved readings but not quite at goal.  We will add losartan 50 mg p.o. daily to regimen and monitor. - hydrochlorothiazide (HYDRODIURIL) 25 MG tablet; Take 1 tablet (25 mg total) by mouth daily.  Dispense: 90 tablet; Refill: 0  2. Mild persistent asthma without complication Improved since steroid.  Meds were refilled.  3. Allergic rhinitis, unspecified seasonality, unspecified trigger Flonase and Zyrtec were prescribed.   Outpatient Encounter Medications as of 07/16/2021  Medication Sig   albuterol (VENTOLIN HFA) 108 (90 Base) MCG/ACT inhaler INHALE 1 TO 2 PUFFS INTO THE LUNGS EVERY 6 HOURS AS NEEDED FOR WHEEZING OR SHORTNESS OF BREATH   amLODipine (NORVASC) 10 MG tablet Take 1 tablet (10 mg total) by mouth daily.   cetirizine (ZYRTEC) 10 MG tablet Take 1 tablet (10 mg total) by mouth daily.   fluticasone (FLONASE) 50 MCG/ACT nasal spray Place 2 sprays into both nostrils daily.   Fluticasone Furoate (ARNUITY ELLIPTA) 100 MCG/ACT AEPB Inhale 2 puffs into the lungs daily.   losartan (COZAAR) 50 MG tablet Take 1 tablet (50 mg total) by mouth daily.   [DISCONTINUED] albuterol (PROVENTIL) (2.5 MG/3ML) 0.083% nebulizer solution Take 3 mLs (2.5 mg total) by nebulization every 6 (six) hours as needed for wheezing or shortness of breath.   [DISCONTINUED] hydrochlorothiazide (HYDRODIURIL) 25 MG tablet  Take 1 tablet (25 mg total) by mouth daily.   albuterol (PROVENTIL) (2.5 MG/3ML) 0.083% nebulizer solution Take 3 mLs (2.5 mg total) by nebulization every 6 (six) hours as needed for wheezing or shortness of breath.   hydrochlorothiazide (HYDRODIURIL) 25 MG tablet Take 1 tablet (25 mg total) by mouth daily.   No facility-administered encounter medications on file as of 07/16/2021.    Follow-up: Return in about 3 months (around 10/16/2021) for follow up.   Tommie Raymond, MD

## 2021-07-26 ENCOUNTER — Other Ambulatory Visit: Payer: Self-pay | Admitting: Physician Assistant

## 2021-07-26 DIAGNOSIS — J4530 Mild persistent asthma, uncomplicated: Secondary | ICD-10-CM

## 2021-08-13 ENCOUNTER — Ambulatory Visit: Payer: BC Managed Care – PPO | Admitting: Family Medicine

## 2021-08-23 ENCOUNTER — Other Ambulatory Visit: Payer: Self-pay | Admitting: Physician Assistant

## 2021-08-23 DIAGNOSIS — J4530 Mild persistent asthma, uncomplicated: Secondary | ICD-10-CM

## 2021-09-03 ENCOUNTER — Ambulatory Visit: Payer: BC Managed Care – PPO | Admitting: Family Medicine

## 2021-10-18 ENCOUNTER — Other Ambulatory Visit: Payer: Self-pay | Admitting: Family Medicine

## 2021-11-16 ENCOUNTER — Other Ambulatory Visit: Payer: Self-pay | Admitting: Family Medicine

## 2021-11-16 DIAGNOSIS — I10 Essential (primary) hypertension: Secondary | ICD-10-CM

## 2022-02-02 ENCOUNTER — Ambulatory Visit (INDEPENDENT_AMBULATORY_CARE_PROVIDER_SITE_OTHER): Payer: BC Managed Care – PPO

## 2022-02-02 ENCOUNTER — Ambulatory Visit (HOSPITAL_COMMUNITY)
Admission: EM | Admit: 2022-02-02 | Discharge: 2022-02-02 | Disposition: A | Discharge status: Home / Self Care | Payer: BC Managed Care – PPO | Attending: Family Medicine | Admitting: Family Medicine

## 2022-02-02 ENCOUNTER — Encounter (HOSPITAL_COMMUNITY): Payer: Self-pay | Admitting: Emergency Medicine

## 2022-02-02 DIAGNOSIS — M542 Cervicalgia: Secondary | ICD-10-CM | POA: Diagnosis not present

## 2022-02-02 DIAGNOSIS — T148XXA Other injury of unspecified body region, initial encounter: Secondary | ICD-10-CM

## 2022-02-02 DIAGNOSIS — M545 Low back pain, unspecified: Secondary | ICD-10-CM

## 2022-02-02 MED ORDER — TIZANIDINE HCL 4 MG PO TABS: 4.0000 mg | ORAL_TABLET | Freq: Four times a day (QID) | ORAL | 0 refills | Status: DC | PRN
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
## 2022-04-28 DIAGNOSIS — Z1231 Encounter for screening mammogram for malignant neoplasm of breast: Secondary | ICD-10-CM | POA: Diagnosis not present

## 2022-05-17 ENCOUNTER — Ambulatory Visit: Payer: BC Managed Care – PPO | Admitting: Family Medicine

## 2022-06-27 ENCOUNTER — Ambulatory Visit (INDEPENDENT_AMBULATORY_CARE_PROVIDER_SITE_OTHER): Payer: BC Managed Care – PPO | Admitting: Family Medicine

## 2022-06-27 ENCOUNTER — Encounter: Payer: Self-pay | Admitting: Family Medicine

## 2022-06-27 VITALS — BP 177/100 | HR 97 | Ht 61.0 in | Wt 191.0 lb

## 2022-06-27 DIAGNOSIS — I10 Essential (primary) hypertension: Secondary | ICD-10-CM

## 2022-06-27 DIAGNOSIS — J4531 Mild persistent asthma with (acute) exacerbation: Secondary | ICD-10-CM

## 2022-06-27 DIAGNOSIS — R519 Headache, unspecified: Secondary | ICD-10-CM

## 2022-06-27 MED ORDER — PREDNISONE 50 MG PO TABS: 50.0000 mg | ORAL_TABLET | Freq: Every day | ORAL | 0 refills | Status: DC

## 2022-06-27 MED ORDER — VALSARTAN 160 MG PO TABS: 160.0000 mg | ORAL_TABLET | Freq: Every day | ORAL | 0 refills | Status: DC

## 2022-06-27 MED ORDER — FLUTICASONE-SALMETEROL 500-50 MCG/ACT IN AEPB: 1.0000 | INHALATION_SPRAY | Freq: Two times a day (BID) | RESPIRATORY_TRACT | 1 refills | Status: DC

## 2022-06-30 NOTE — Progress Notes (Signed)
Established Patient Office Visit  Subjective    Patient ID: Kelly Meyers, female    DOB: 03/27/1976  Age: 46 y.o. MRN: [id number]  CC:  Chief Complaint  Patient presents with   Uncontrolled asthma   Headache    Dizziness      HPI Kelly Meyers presents for routine follow up of hypertension. Patient reports that she has not been taking meds as recommended. She also has been having headaches. She recently began having increased asthma sx.    Outpatient Encounter Medications as of 06/27/2022  Medication Sig   albuterol (VENTOLIN HFA) 108 (90 Base) MCG/ACT inhaler INHALE 1 TO 2 PUFFS INTO THE LUNGS EVERY 6 HOURS AS NEEDED FOR WHEEZING OR SHORTNESS OF BREATH   cetirizine (ZYRTEC) 10 MG tablet Take 1 tablet (10 mg total) by mouth daily.   fluticasone (FLONASE) 50 MCG/ACT nasal spray Place 2 sprays into both nostrils daily.   Fluticasone Furoate (ARNUITY ELLIPTA) 100 MCG/ACT AEPB Inhale 2 puffs into the lungs daily.   fluticasone-salmeterol (ADVAIR DISKUS) 500-50 MCG/ACT AEPB Inhale 1 puff into the lungs in the morning and at bedtime.   hydrochlorothiazide (HYDRODIURIL) 25 MG tablet TAKE 1 TABLET(25 MG) BY MOUTH DAILY   predniSONE (DELTASONE) 50 MG tablet Take 1 tablet (50 mg total) by mouth daily with breakfast.   tiZANidine (ZANAFLEX) 4 MG tablet Take 1 tablet (4 mg total) by mouth every 6 (six) hours as needed for muscle spasms.   valsartan (DIOVAN) 160 MG tablet Take 1 tablet (160 mg total) by mouth daily.   albuterol (PROVENTIL) (2.5 MG/3ML) 0.083% nebulizer solution USE 1 VIAL VIA NEBULIZER EVERY 6 HOURS AS NEEDED FOR WHEEZING OR SHORTNESS OF BREATH (Patient not taking: Reported on 06/27/2022)   No facility-administered encounter medications on file as of 06/27/2022.    Past Medical History:  Diagnosis Date   Allergy    Anemia    Asthma    Hypertension     Past Surgical History:  Procedure Laterality Date   CESAREAN SECTION      Family History  Problem  Relation Age of Onset   Diabetes Mother    Multiple sclerosis Mother    Hypertension Father    Diabetes Father     Social History   Socioeconomic History   Marital status: Single    Spouse name: Not on file   Number of children: Not on file   Years of education: Not on file   Highest education level: Not on file  Occupational History   Not on file  Tobacco Use   Smoking status: Never   Smokeless tobacco: Never  Substance and Sexual Activity   Alcohol use: Yes    Comment: occ   Drug use: No   Sexual activity: Yes    Birth control/protection: I.U.D.  Other Topics Concern   Not on file  Social History Narrative   Not on file   Social Determinants of Health   Financial Resource Strain: Not on file  Food Insecurity: Not on file  Transportation Needs: Not on file  Physical Activity: Not on file  Stress: Not on file  Social Connections: Not on file  Intimate Partner Violence: Not on file    Review of Systems  HENT:  Positive for congestion.   Respiratory:  Positive for cough and wheezing.   Neurological:  Positive for headaches. Negative for weakness.  All other systems reviewed and are negative.       Objective    BP Marland Kitchen)  177/100   Pulse 97   Ht 5\' 1"  (1.549 m)   Wt 191 lb (86.6 kg)   SpO2 96%   BMI 36.09 kg/m   Physical Exam Vitals and nursing note reviewed.  Constitutional:      General: She is not in acute distress. Cardiovascular:     Rate and Rhythm: Normal rate and regular rhythm.  Pulmonary:     Effort: Pulmonary effort is normal.     Breath sounds: Normal breath sounds.  Abdominal:     Palpations: Abdomen is soft.     Tenderness: There is no abdominal tenderness.  Musculoskeletal:     Right lower leg: No edema.     Left lower leg: No edema.  Neurological:     General: No focal deficit present.     Mental Status: She is alert and oriented to person, place, and time.         Assessment & Plan:   1. Uncontrolled  hypertension Elevated reading. Discussed compliance. Will prescribe valsartan 160 mg daily and monitor  2. Nonintractable headache, unspecified chronicity pattern, unspecified headache type Most likely 2/2 to above  3. Mild persistent asthma with acute exacerbation Advair prescribed. Prednisone prescribed. Continue albuterol prn  Return in about 1 week (around 07/04/2022).   09/03/2022, MD

## 2022-07-08 ENCOUNTER — Ambulatory Visit: Payer: BC Managed Care – PPO | Admitting: Family Medicine

## 2022-07-08 VITALS — BP 156/94 | HR 81 | Temp 98.1°F | Resp 16 | Wt 186.2 lb

## 2022-07-08 DIAGNOSIS — F17210 Nicotine dependence, cigarettes, uncomplicated: Secondary | ICD-10-CM | POA: Diagnosis not present

## 2022-07-08 DIAGNOSIS — I10 Essential (primary) hypertension: Secondary | ICD-10-CM

## 2022-07-08 DIAGNOSIS — J4530 Mild persistent asthma, uncomplicated: Secondary | ICD-10-CM | POA: Diagnosis not present

## 2022-07-08 DIAGNOSIS — J4531 Mild persistent asthma with (acute) exacerbation: Secondary | ICD-10-CM

## 2022-07-08 DIAGNOSIS — F17200 Nicotine dependence, unspecified, uncomplicated: Secondary | ICD-10-CM

## 2022-07-08 MED ORDER — VALSARTAN 320 MG PO TABS: 320.0000 mg | ORAL_TABLET | Freq: Every day | ORAL | 0 refills | Status: DC

## 2022-07-12 ENCOUNTER — Encounter: Payer: Self-pay | Admitting: Family Medicine

## 2022-07-12 NOTE — Progress Notes (Signed)
Established Patient Office Visit  Subjective    Patient ID: Kelly Meyers, female    DOB: Dec 12, 1975  Age: 46 y.o. MRN: [id number]  CC:  Chief Complaint  Patient presents with   Follow-up   Hypertension    HPI DENEICE WACK presents for follow up of chronic med issues. Patient denies acute complaints or concerns.    Outpatient Encounter Medications as of 07/08/2022  Medication Sig   albuterol (VENTOLIN HFA) 108 (90 Base) MCG/ACT inhaler INHALE 1 TO 2 PUFFS INTO THE LUNGS EVERY 6 HOURS AS NEEDED FOR WHEEZING OR SHORTNESS OF BREATH   cetirizine (ZYRTEC) 10 MG tablet Take 1 tablet (10 mg total) by mouth daily.   fluticasone (FLONASE) 50 MCG/ACT nasal spray Place 2 sprays into both nostrils daily.   Fluticasone Furoate (ARNUITY ELLIPTA) 100 MCG/ACT AEPB Inhale 2 puffs into the lungs daily.   fluticasone-salmeterol (ADVAIR DISKUS) 500-50 MCG/ACT AEPB Inhale 1 puff into the lungs in the morning and at bedtime.   hydrochlorothiazide (HYDRODIURIL) 25 MG tablet TAKE 1 TABLET(25 MG) BY MOUTH DAILY   predniSONE (DELTASONE) 50 MG tablet Take 1 tablet (50 mg total) by mouth daily with breakfast.   tiZANidine (ZANAFLEX) 4 MG tablet Take 1 tablet (4 mg total) by mouth every 6 (six) hours as needed for muscle spasms.   valsartan (DIOVAN) 320 MG tablet Take 1 tablet (320 mg total) by mouth daily.   [DISCONTINUED] valsartan (DIOVAN) 160 MG tablet Take 1 tablet (160 mg total) by mouth daily.   albuterol (PROVENTIL) (2.5 MG/3ML) 0.083% nebulizer solution USE 1 VIAL VIA NEBULIZER EVERY 6 HOURS AS NEEDED FOR WHEEZING OR SHORTNESS OF BREATH (Patient not taking: Reported on 06/27/2022)   No facility-administered encounter medications on file as of 07/08/2022.    Past Medical History:  Diagnosis Date   Allergy    Anemia    Asthma    Hypertension     Past Surgical History:  Procedure Laterality Date   CESAREAN SECTION      Family History  Problem Relation Age of Onset   Diabetes  Mother    Multiple sclerosis Mother    Hypertension Father    Diabetes Father     Social History   Socioeconomic History   Marital status: Single    Spouse name: Not on file   Number of children: Not on file   Years of education: Not on file   Highest education level: Not on file  Occupational History   Not on file  Tobacco Use   Smoking status: Never   Smokeless tobacco: Never  Substance and Sexual Activity   Alcohol use: Yes    Comment: occ   Drug use: No   Sexual activity: Yes    Birth control/protection: I.U.D.  Other Topics Concern   Not on file  Social History Narrative   Not on file   Social Determinants of Health   Financial Resource Strain: Not on file  Food Insecurity: Not on file  Transportation Needs: Not on file  Physical Activity: Not on file  Stress: Not on file  Social Connections: Not on file  Intimate Partner Violence: Not on file    Review of Systems  All other systems reviewed and are negative.       Objective    BP (!) 156/94   Pulse 81   Temp 98.1 F (36.7 C) (Oral)   Resp 16   Wt 186 lb 3.2 oz (84.5 kg)   SpO2 97%  BMI 35.18 kg/m   Physical Exam Vitals and nursing note reviewed.  Constitutional:      General: She is not in acute distress. Cardiovascular:     Rate and Rhythm: Normal rate and regular rhythm.  Pulmonary:     Effort: Pulmonary effort is normal.     Breath sounds: Normal breath sounds. No wheezing.  Abdominal:     Palpations: Abdomen is soft.     Tenderness: There is no abdominal tenderness.  Musculoskeletal:     Right lower leg: No edema.     Left lower leg: No edema.  Neurological:     General: No focal deficit present.     Mental Status: She is alert and oriented to person, place, and time.         Assessment & Plan:   1. Mild persistent asthma without complication Much improved with present management. Continue and monitor  2. Essential hypertension Improved but readings remain elevated  with present management. Will increase valsartan from 160mg  to 320 mg and monitor  3. Smoking Discussed cessation/reduction    No follow-ups on file.   , MD

## 2022-07-23 ENCOUNTER — Other Ambulatory Visit: Payer: Self-pay | Admitting: Family Medicine

## 2022-08-03 ENCOUNTER — Ambulatory Visit: Payer: BC Managed Care – PPO | Admitting: Family Medicine

## 2022-09-08 ENCOUNTER — Ambulatory Visit: Payer: Self-pay | Admitting: *Deleted

## 2022-09-08 ENCOUNTER — Other Ambulatory Visit: Payer: Self-pay | Admitting: Family Medicine

## 2022-09-08 DIAGNOSIS — J4530 Mild persistent asthma, uncomplicated: Secondary | ICD-10-CM

## 2022-09-08 MED ORDER — FLUTICASONE-SALMETEROL 500-50 MCG/ACT IN AEPB: 1.0000 | INHALATION_SPRAY | Freq: Two times a day (BID) | RESPIRATORY_TRACT | 1 refills | Status: AC

## 2022-09-08 MED ORDER — ALBUTEROL SULFATE (2.5 MG/3ML) 0.083% IN NEBU: INHALATION_SOLUTION | RESPIRATORY_TRACT | 0 refills | Status: DC

## 2022-09-08 NOTE — Telephone Encounter (Signed)
Requested Prescriptions  Pending Prescriptions Disp Refills   albuterol (VENTOLIN HFA) 108 (90 Base) MCG/ACT inhaler [Pharmacy Med Name: ALBUTEROL HFA INH (200 PUFFS) 6.7GM] 6.7 g 1    Sig: INHALE 1 TO 2 PUFFS INTO THE LUNGS EVERY 6 HOURS AS NEEDED FOR WHEEZING OR SHORTNESS OF BREATH     Pulmonology:  Beta Agonists 2 Failed - 09/08/2022  8:08 AM      Failed - Last BP in normal range    BP Readings from Last 1 Encounters:  07/08/22 (!) 156/94         Passed - Last Heart Rate in normal range    Pulse Readings from Last 1 Encounters:  07/08/22 81         Passed - Valid encounter within last 12 months    Recent Outpatient Visits           2 months ago Mild persistent asthma without complication   Primary Care at [facility], MD   2 months ago Uncontrolled hypertension   Primary Care at [facility], MD   1 year ago Essential hypertension   Primary Care at [facility], Lauris Poag, MD   1 year ago Mild persistent asthma without complication   Primary Care at [facility], MD   1 year ago Viral upper respiratory tract infection   Primary Care at [facility], Jomarie Longs, FNP       Future Appointments             Tomorrow Georganna Skeans, MD Primary Care at [facility]             albuterol (PROVENTIL) (2.5 MG/3ML) 0.083% nebulizer solution 75 mL 0    Sig: USE 1 VIAL VIA NEBULIZER EVERY 6 HOURS AS NEEDED FOR WHEEZING OR SHORTNESS OF BREATH     Pulmonology:  Beta Agonists 2 Failed - 09/08/2022  8:08 AM      Failed - Last BP in normal range    BP Readings from Last 1 Encounters:  07/08/22 (!) 156/94         Passed - Last Heart Rate in normal range    Pulse Readings from Last 1 Encounters:  07/08/22 81         Passed - Valid encounter within last 12 months    Recent Outpatient Visits           2 months ago Mild persistent asthma without complication   Primary Care at [facility], MD    2 months ago Uncontrolled hypertension   Primary Care at [facility], MD   1 year ago Essential hypertension   Primary Care at [facility], Lauris Poag, MD   1 year ago Mild persistent asthma without complication   Primary Care at [facility], MD   1 year ago Viral upper respiratory tract infection   Primary Care at [facility], FNP       Future Appointments             Tomorrow Georganna Skeans, MD Primary Care at [facility]             fluticasone-salmeterol (ADVAIR DISKUS) 500-50 MCG/ACT AEPB 60 each 1    Sig: Inhale 1 puff into the lungs in the morning and at bedtime.     Pulmonology:  Combination Products Passed - 09/08/2022  8:08 AM      Passed - Valid encounter within last 12  months    Recent Outpatient Visits           2 months ago Mild persistent asthma without complication   Primary Care at [facility], MD   2 months ago Uncontrolled hypertension   Primary Care at [facility], MD   1 year ago Essential hypertension   Primary Care at [facility], MD   1 year ago Mild persistent asthma without complication   Primary Care at [facility], MD   1 year ago Viral upper respiratory tract infection   Primary Care at [facility], FNP       Future Appointments             Tomorrow Georganna Skeans, MD Primary Care at [facility]

## 2022-09-08 NOTE — Telephone Encounter (Signed)
Appt given

## 2022-09-08 NOTE — Telephone Encounter (Addendum)
  Chief Complaint: "Asthma" Symptoms: Dry cough,headache, chills, body aches,SOB at rest, some wheezing. Frequency: yesterday Pertinent Negatives: Patient denies fever Disposition: [] ED /[x] Urgent Care (no appt availability in office) / [] Appointment(In office/virtual)/ []  [place]/ [] Home Care/ [] Refused Recommended Disposition /[] [place] [facility]/ []  Follow-up with PCP Additional Notes: Pt calling for refill of inhalers, transferred to NT as pt mentioned SOB.  Does not have nebulizer, "Broken."  Wheezing. Advised to test for Covid, has home test, advised UC.  Pt states she will test for covid and CB with results. States she will go to UC "After I sign in for work."  Unsure pt will follow disposition. States she cannot take more time off work. I did refill inhalers as requested.  Pt has already secured appt for tomorrow, 1 month F/U.    9:05 Pt returned call, Covid negative, home test. Reason for Disposition  [1] MODERATE asthma attack (e.g., SOB at rest, speaks in phrases, audible wheezes) AND [2] doesn't have neb or inhaler available  Answer Assessment - Initial Assessment Questions 1. RESPIRATORY STATUS: "Describe your breathing?" (e.g., wheezing, shortness of breath, unable to speak, severe coughing)      *No Answer* 2. ONSET: "When did this asthma attack begin?"      Yesterday 3. TRIGGER: "What do you think triggered this attack?" (e.g., URI, exposure to pollen or other allergen, tobacco smoke)      Weather. 4. PEAK EXPIRATORY FLOW RATE (PEFR): "Do you use a peak flow meter?" If Yes, ask: "What's the current peak flow? What's your personal best peak flow?"      Not at home 5. SEVERITY: "How bad is this attack?"    - MILD: No SOB at rest, mild SOB with walking, speaks normally in sentences, can lie down, no retractions, pulse < 100. (GREEN Zone: PEFR 80-100%)   - MODERATE: SOB at rest, SOB with minimal exertion and prefers to sit, cannot lie down flat, speaks in  phrases, mild retractions, audible wheezing, pulse 100-120. (YELLOW Zone: PEFR 50-79%)    - SEVERE: Struggling for each breath, speaks in single words, struggling to breathe, sitting hunched forward, retractions, usually loud wheezing, sometimes minimal wheezing because of decreased air movement, pulse > 120. (RED Zone: PEFR < 50%).      Moderate 6. ASTHMA MEDICINES:  "What treatments have you tried?"    - INHALED QUICK RELIEF (RESCUE): "What is your inhaled quick-relief medicine?" (e.g., albuterol, salbutamol) "Do you use an inhaler or a nebulizer?" "How frequently have you been using this medicine?"   - CONTROLLER (LONG-TERM-CONTROL): "Do you take an inhaled steroid? (e.g., Asmanex, Flovent, Pulmicort, Qvar)      7. INHALED QUICK-RELIEF TREATMENTS FOR THIS ATTACK: "What treatments have you given yourself so far?" and "How many and how often?" If using an inhaler, ask, "How many puffs?" Note: Routine treatments are 2 puffs every 4 hours as needed. Rescue treatments are 4 puffs repeated every 20 minutes, up to three times as needed.       8. OTHER SYMPTOMS: "Do you have any other symptoms? (e.g., chest pain, coughing up yellow sputum, fever, runny nose)     Dry cough, chills , aches,headache 9. O2 SATURATION MONITOR:  "Do you use an oxygen saturation monitor (pulse oximeter) at home?" If Yes, "What is your reading (oxygen level) today?" "What is your usual oxygen saturation reading?" (e.g., 95%)     Not at home  Protocols used: Asthma Attack-A-AH

## 2022-09-09 ENCOUNTER — Ambulatory Visit (INDEPENDENT_AMBULATORY_CARE_PROVIDER_SITE_OTHER): Payer: BC Managed Care – PPO | Admitting: Family Medicine

## 2022-09-09 ENCOUNTER — Encounter: Payer: Self-pay | Admitting: Family Medicine

## 2022-09-09 VITALS — BP 140/84 | HR 116 | Temp 99.8°F | Resp 20 | Ht 61.0 in | Wt 186.0 lb

## 2022-09-09 DIAGNOSIS — B349 Viral infection, unspecified: Secondary | ICD-10-CM

## 2022-09-09 DIAGNOSIS — J4531 Mild persistent asthma with (acute) exacerbation: Secondary | ICD-10-CM

## 2022-09-09 MED ORDER — PREDNISONE 50 MG PO TABS: 50.0000 mg | ORAL_TABLET | Freq: Every day | ORAL | 0 refills | Status: DC

## 2022-09-09 NOTE — Progress Notes (Signed)
Body aches, chills, upper respiratory sx's x 2 day

## 2022-09-10 LAB — COVID-19, FLU A+B AND RSV
Influenza A, NAA: DETECTED — AB
Influenza B, NAA: NOT DETECTED
RSV, NAA: NOT DETECTED
SARS-CoV-2, NAA: NOT DETECTED

## 2022-09-12 NOTE — Progress Notes (Signed)
Established Patient Office Visit  Subjective    Patient ID: Kelly Meyers, female    DOB: [birth year]  Age: 46 y.o. MRN: [id number]  CC:  Chief Complaint  Patient presents with   Hypertension    HPI MALAKAI BEATON presents with complaint of fever/chills, body aches, cough for several days. Patient denies known contacts or exposures.    Outpatient Encounter Medications as of 09/09/2022  Medication Sig   albuterol (PROVENTIL) (2.5 MG/3ML) 0.083% nebulizer solution USE 1 VIAL VIA NEBULIZER EVERY 6 HOURS AS NEEDED FOR WHEEZING OR SHORTNESS OF BREATH   albuterol (VENTOLIN HFA) 108 (90 Base) MCG/ACT inhaler INHALE 1 TO 2 PUFFS INTO THE LUNGS EVERY 6 HOURS AS NEEDED FOR WHEEZING OR SHORTNESS OF BREATH   cetirizine (ZYRTEC) 10 MG tablet Take 1 tablet (10 mg total) by mouth daily.   fluticasone (FLONASE) 50 MCG/ACT nasal spray SHAKE LIQUID AND USE 2 SPRAYS IN EACH NOSTRIL DAILY   Fluticasone Furoate (ARNUITY ELLIPTA) 100 MCG/ACT AEPB Inhale 2 puffs into the lungs daily.   fluticasone-salmeterol (ADVAIR DISKUS) 500-50 MCG/ACT AEPB Inhale 1 puff into the lungs in the morning and at bedtime.   hydrochlorothiazide (HYDRODIURIL) 25 MG tablet TAKE 1 TABLET(25 MG) BY MOUTH DAILY   predniSONE (DELTASONE) 50 MG tablet Take 1 tablet (50 mg total) by mouth daily with breakfast.   valsartan (DIOVAN) 320 MG tablet Take 1 tablet (320 mg total) by mouth daily.   [DISCONTINUED] predniSONE (DELTASONE) 50 MG tablet Take 1 tablet (50 mg total) by mouth daily with breakfast. (Patient not taking: Reported on 09/09/2022)   [DISCONTINUED] tiZANidine (ZANAFLEX) 4 MG tablet Take 1 tablet (4 mg total) by mouth every 6 (six) hours as needed for muscle spasms. (Patient not taking: Reported on 09/09/2022)   No facility-administered encounter medications on file as of 09/09/2022.    Past Medical History:  Diagnosis Date   Allergy    Anemia    Asthma    Hypertension     Past Surgical History:  Procedure  Laterality Date   CESAREAN SECTION      Family History  Problem Relation Age of Onset   Diabetes Mother    Multiple sclerosis Mother    Hypertension Father    Diabetes Father     Social History   Socioeconomic History   Marital status: Single    Spouse name: Not on file   Number of children: Not on file   Years of education: Not on file   Highest education level: Not on file  Occupational History   Not on file  Tobacco Use   Smoking status: Never   Smokeless tobacco: Never  Substance and Sexual Activity   Alcohol use: Yes    Comment: occ   Drug use: No   Sexual activity: Yes    Birth control/protection: I.U.D.  Other Topics Concern   Not on file  Social History Narrative   Not on file   Social Determinants of Health   Financial Resource Strain: Not on file  Food Insecurity: Not on file  Transportation Needs: Not on file  Physical Activity: Not on file  Stress: Not on file  Social Connections: Not on file  Intimate Partner Violence: Not on file    Review of Systems  Constitutional:  Positive for chills, fever and malaise/fatigue.  Respiratory:  Positive for cough. Negative for shortness of breath.   All other systems reviewed and are negative.       Objective    BP Marland Kitchen)  140/84 (BP Location: Right Arm, Patient Position: Sitting, Cuff Size: Large)   Pulse (!) 116   Temp 99.8 F (37.7 C) (Oral)   Resp 20   Ht 5\' 1"  (1.549 m)   Wt 186 lb (84.4 kg)   LMP 08/19/2022   SpO2 94%   BMI 35.14 kg/m   Physical Exam Vitals and nursing note reviewed.  Constitutional:      General: She is not in acute distress. Cardiovascular:     Rate and Rhythm: Normal rate and regular rhythm.  Pulmonary:     Effort: Pulmonary effort is normal.     Breath sounds: Wheezing present.  Neurological:     General: No focal deficit present.     Mental Status: She is alert and oriented to person, place, and time.         Assessment & Plan:   1. Viral  syndrome Testing pending - COVID-19, Flu A+B and RSV  2. Mild persistent asthma with acute exacerbation Prednisone prescribed. Tylenol/nsaids prn.     No follow-ups on file.   08/21/2022, MD

## 2022-09-19 ENCOUNTER — Ambulatory Visit: Payer: Self-pay | Admitting: *Deleted

## 2022-09-19 NOTE — Telephone Encounter (Signed)
    Chief Complaint: Pt. Continues to have sore, scratchy throat. "Feels strained." Seen 09/09/22. Symptoms: Above Frequency: 09/09/22 Pertinent Negatives: Patient denies fever Disposition: [] ED /[] Urgent Care (no appt availability in office) / [] Appointment(In office/virtual)/ []  [place]/ [] Home Care/ [] Refused Recommended Disposition /[] [facility]/ [x]  Follow-up with PCP Additional Notes: Asking for further advise from PCP for throat symptoms.  Answer Assessment - Initial Assessment Questions 1. WORST SYMPTOM: "What is your worst symptom?" (e.g., cough, runny nose, muscle aches, headache, sore throat, fever)      Sore throat, feels strained 2. ONSET: "When did your flu symptoms start?"      Last week 3. COUGH: "How bad is the cough?"       No 4. RESPIRATORY DISTRESS: "Describe your breathing."      None 5. FEVER: "Do you have a fever?" If Yes, ask: "What is your temperature, how was it measured, and when did it start?"     No 6. EXPOSURE: "Were you exposed to someone with influenza?"       No 7. FLU VACCINE: "Did you get a flu shot this year?"     No 8. HIGH RISK DISEASE: "Do you have any chronic medical problems?" (e.g., heart or lung disease, asthma, weak immune system, or other HIGH RISK conditions)     No 9. PREGNANCY: "Is there any chance you are pregnant?" "When was your last menstrual period?"     No 10. OTHER SYMPTOMS: "Do you have any other symptoms?"  (e.g., runny nose, muscle aches, headache, sore throat)       No  Protocols used: Influenza (Flu) - [facility]

## 2022-09-19 NOTE — Telephone Encounter (Signed)
Summary: fu on the flu and symptoms.   Pt was diagnosed with the flu on the 8th of dec.  She is still feeling bad with a constant scratchy throat , cough, and chest congestion. CB@ [phone]

## 2022-09-19 NOTE — Telephone Encounter (Signed)
Summary: fu on the flu and symptoms.   Pt was diagnosed with the flu on the 8th of dec.  She is still feeling bad with a constant scratchy throat , cough, and chest congestion. CB@ [phone]      2nd call attempted to contact patient on #[phone] to review on going LVMTCB [phone].

## 2022-09-20 ENCOUNTER — Encounter: Payer: Self-pay | Admitting: *Deleted

## 2022-09-20 NOTE — Telephone Encounter (Signed)
Msg sent to pt via mychart

## 2022-10-20 ENCOUNTER — Other Ambulatory Visit: Payer: Self-pay | Admitting: Family Medicine

## 2022-10-20 NOTE — Telephone Encounter (Signed)
Requested Prescriptions  Pending Prescriptions Disp Refills   albuterol (PROVENTIL) (2.5 MG/3ML) 0.083% nebulizer solution [Pharmacy Med Name: ALBUTEROL 0.083%(2.5MG /3ML) 25X3ML] 75 mL 0    Sig: USE 1 VIAL VIA NEBULIZER EVERY 6 HOURS AS NEEDED FOR WHEEZING OR SHORTNESS OF BREATH     Pulmonology:  Beta Agonists 2 Failed - 10/20/2022  2:26 PM      Failed - Last BP in normal range    BP Readings from Last 1 Encounters:  09/09/22 (!) 140/84         Failed - Last Heart Rate in normal range    Pulse Readings from Last 1 Encounters:  09/09/22 (!) 116         Passed - Valid encounter within last 12 months    Recent Outpatient Visits           1 month ago Viral syndrome   Primary Care at [facility], Clyde Canterbury, MD   3 months ago Mild persistent asthma without complication   Primary Care at [facility], MD   3 months ago Uncontrolled hypertension   Primary Care at [facility], MD   1 year ago Essential hypertension   Primary Care at [facility], Clyde Canterbury, MD   1 year ago Mild persistent asthma without complication   Primary Care at [facility], MD
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
## 2023-03-09 ENCOUNTER — Ambulatory Visit: Payer: Self-pay

## 2023-03-09 NOTE — Telephone Encounter (Signed)
Called pt - left message on machine to return our call.

## 2023-03-09 NOTE — Telephone Encounter (Signed)
Pt called for appt for FMLA paperwork to be completed.  Pt mentioned to agent that she is having some chest tightness d/t asthma. She states that it is not any different than usual.  Pt unable to stay on phone for triage. Pt requests a call between 2-3 pm today which is her break time.

## 2023-03-09 NOTE — Telephone Encounter (Signed)
  Chief Complaint: Chest tightness - pain intermittent Symptoms: above Frequency: becoming more often Pertinent Negatives: Patient denies Current chest tightness Disposition: [x] ED /[] Urgent Care (no appt availability in office) / [] Appointment(In office/virtual)/ []  [facility]/ [] Home Care/ [] Refused Recommended Disposition /[] [facility]/ []  Follow-up with PCP Additional Notes: PT states that she has chest tightness from time to time. She also has a pain on the left side of her chest that radiates to her back. She also tells me that there are times when she has a sharp pain on on left side that lasts about 5 minutes. She states that feels like trapped gas. Pt will go to ED for evaluation.    Reason for Disposition  [1] Chest pain (or "angina") comes and goes AND [2] is happening more often (increasing in frequency) or getting worse (increasing in severity)  (Exception: Chest pains that last only a few seconds.)  Answer Assessment - Initial Assessment Questions 1. LOCATION: "Where does it hurt?"       Tightness is between breasts - more to the left and middle to upper back 2. RADIATION: "Does the pain go anywhere else?" (e.g., into neck, jaw, arms, back)     Back 3. ONSET: "When did the chest pain begin?" (Minutes, hours or days)      years 4. PATTERN: "Does the pain come and go, or has it been constant since it started?"  "Does it get worse with exertion?"      Comes and goes 5. DURATION: "How long does it last" (e.g., seconds, minutes, hours)     5 minutes 6. SEVERITY: "How bad is the pain?"  (e.g., Scale 1-10; mild, moderate, or severe)    - MILD (1-3): doesn't interfere with normal activities     - MODERATE (4-7): interferes with normal activities or awakens from sleep    - SEVERE (8-10): excruciating pain, unable to do any normal activities       Moderate - trapped gas feeling 7. CARDIAC RISK FACTORS: "Do you have any history of heart problems or risk  factors for heart disease?" (e.g., angina, prior heart attack; diabetes, high blood pressure, high cholesterol, smoker, or strong family history of heart disease)     Not aware of any. 8. PULMONARY RISK FACTORS: "Do you have any history of lung disease?"  (e.g., blood clots in lung, asthma, emphysema, birth control pills)     Asthma 9. CAUSE: "What do you think is causing the chest pain?"     Asthma 10. OTHER SYMPTOMS: "Do you have any other symptoms?" (e.g., dizziness, nausea, vomiting, sweating, fever, difficulty breathing, cough)       Chest tightness  Protocols used: Chest Pain-A-AH

## 2023-04-28 DIAGNOSIS — Z113 Encounter for screening for infections with a predominantly sexual mode of transmission: Secondary | ICD-10-CM | POA: Diagnosis not present

## 2023-04-28 DIAGNOSIS — Z1151 Encounter for screening for human papillomavirus (HPV): Secondary | ICD-10-CM | POA: Diagnosis not present

## 2023-04-28 DIAGNOSIS — Z6836 Body mass index (BMI) 36.0-36.9, adult: Secondary | ICD-10-CM | POA: Diagnosis not present

## 2023-04-28 DIAGNOSIS — N939 Abnormal uterine and vaginal bleeding, unspecified: Secondary | ICD-10-CM | POA: Diagnosis not present

## 2023-04-28 DIAGNOSIS — L0292 Furuncle, unspecified: Secondary | ICD-10-CM | POA: Diagnosis not present

## 2023-04-28 DIAGNOSIS — Z124 Encounter for screening for malignant neoplasm of cervix: Secondary | ICD-10-CM | POA: Diagnosis not present

## 2023-04-28 DIAGNOSIS — Z01419 Encounter for gynecological examination (general) (routine) without abnormal findings: Secondary | ICD-10-CM | POA: Diagnosis not present

## 2023-08-08 ENCOUNTER — Ambulatory Visit: Payer: BC Managed Care – PPO | Admitting: Family Medicine

## 2023-08-08 ENCOUNTER — Encounter: Payer: Self-pay | Admitting: Family Medicine

## 2023-08-08 VITALS — BP 145/91 | HR 86 | Temp 99.1°F | Resp 18 | Ht 61.0 in | Wt 197.6 lb

## 2023-08-08 DIAGNOSIS — I10 Essential (primary) hypertension: Secondary | ICD-10-CM | POA: Diagnosis not present

## 2023-08-08 DIAGNOSIS — R053 Chronic cough: Secondary | ICD-10-CM

## 2023-08-08 DIAGNOSIS — J4530 Mild persistent asthma, uncomplicated: Secondary | ICD-10-CM

## 2023-08-08 DIAGNOSIS — Z0289 Encounter for other administrative examinations: Secondary | ICD-10-CM | POA: Diagnosis not present

## 2023-08-08 DIAGNOSIS — Z23 Encounter for immunization: Secondary | ICD-10-CM | POA: Diagnosis not present

## 2023-08-08 MED ORDER — METHYLPREDNISOLONE 4 MG PO TBPK: ORAL_TABLET | ORAL | 0 refills | Status: DC

## 2023-08-08 MED ORDER — VALSARTAN 320 MG PO TABS: 320.0000 mg | ORAL_TABLET | Freq: Every day | ORAL | 1 refills | Status: AC

## 2023-08-08 MED ORDER — HYDROCHLOROTHIAZIDE 25 MG PO TABS: 25.0000 mg | ORAL_TABLET | Freq: Every day | ORAL | 1 refills | Status: AC

## 2023-08-08 MED ORDER — OMEPRAZOLE 20 MG PO CPDR: 20.0000 mg | DELAYED_RELEASE_CAPSULE | Freq: Every day | ORAL | 0 refills | Status: DC

## 2023-08-08 MED ORDER — ARNUITY ELLIPTA 100 MCG/ACT IN AEPB: 2.0000 | INHALATION_SPRAY | Freq: Every day | RESPIRATORY_TRACT | 1 refills | Status: AC

## 2023-08-09 NOTE — Progress Notes (Unsigned)
Established Patient Office Visit  Subjective    Patient ID: Kelly Meyers, female    DOB: May 20, 1976  Age: 47 y.o. MRN: [id number]  CC:  Chief Complaint  Patient presents with   Asthma    Cough over 4 months, choking really bad    HPI Kelly Meyers presents with complaint of cough for about 4 months. She does have a history of asthma. She also wants paperwork completed for disability for her job.  Outpatient Encounter Medications as of 08/08/2023  Medication Sig   albuterol (PROVENTIL) (2.5 MG/3ML) 0.083% nebulizer solution USE 1 VIAL VIA NEBULIZER EVERY 6 HOURS AS NEEDED FOR WHEEZING OR SHORTNESS OF BREATH   albuterol (VENTOLIN HFA) 108 (90 Base) MCG/ACT inhaler INHALE 1 TO 2 PUFFS INTO THE LUNGS EVERY 6 HOURS AS NEEDED FOR WHEEZING OR SHORTNESS OF BREATH   cetirizine (ZYRTEC) 10 MG tablet Take 1 tablet (10 mg total) by mouth daily.   fluticasone (FLONASE) 50 MCG/ACT nasal spray SHAKE LIQUID AND USE 2 SPRAYS IN EACH NOSTRIL DAILY   fluticasone-salmeterol (ADVAIR DISKUS) 500-50 MCG/ACT AEPB Inhale 1 puff into the lungs in the morning and at bedtime.   methylPREDNISolone (MEDROL DOSEPAK) 4 MG TBPK tablet Take daily as directed   omeprazole (PRILOSEC) 20 MG capsule Take 1 capsule (20 mg total) by mouth daily.   predniSONE (DELTASONE) 50 MG tablet Take 1 tablet (50 mg total) by mouth daily with breakfast.   [DISCONTINUED] Fluticasone Furoate (ARNUITY ELLIPTA) 100 MCG/ACT AEPB Inhale 2 puffs into the lungs daily.   [DISCONTINUED] hydrochlorothiazide (HYDRODIURIL) 25 MG tablet TAKE 1 TABLET(25 MG) BY MOUTH DAILY   [DISCONTINUED] valsartan (DIOVAN) 320 MG tablet Take 1 tablet (320 mg total) by mouth daily.   Fluticasone Furoate (ARNUITY ELLIPTA) 100 MCG/ACT AEPB Inhale 2 puffs into the lungs daily.   hydrochlorothiazide (HYDRODIURIL) 25 MG tablet Take 1 tablet (25 mg total) by mouth daily.   valsartan (DIOVAN) 320 MG tablet Take 1 tablet (320 mg total) by mouth daily.   No  facility-administered encounter medications on file as of 08/08/2023.    Past Medical History:  Diagnosis Date   Allergy    Anemia    Asthma    Hypertension     Past Surgical History:  Procedure Laterality Date   CESAREAN SECTION      Family History  Problem Relation Age of Onset   Diabetes Mother    Multiple sclerosis Mother    Hypertension Father    Diabetes Father     Social History   Socioeconomic History   Marital status: Single    Spouse name: Not on file   Number of children: Not on file   Years of education: Not on file   Highest education level: Not on file  Occupational History   Not on file  Tobacco Use   Smoking status: Never   Smokeless tobacco: Never  Substance and Sexual Activity   Alcohol use: Yes    Comment: occ   Drug use: No   Sexual activity: Yes    Birth control/protection: I.U.D.  Other Topics Concern   Not on file  Social History Narrative   Not on file   Social Determinants of Health   Financial Resource Strain: Not on file  Food Insecurity: No Food Insecurity (08/08/2023)   Hunger Vital Sign    Worried About Running Out of Food in the Last Year: Never true    Ran Out of Food in the Last Year: Never true  Transportation Needs: No Transportation Needs (08/08/2023)   PRAPARE - Administrator, Civil Service (Medical): No    Lack of Transportation (Non-Medical): No  Physical Activity: Not on file  Stress: Not on file  Social Connections: Moderately Integrated (08/08/2023)   Social Connection and Isolation Panel [NHANES]    Frequency of Communication with Friends and Family: Twice a week    Frequency of Social Gatherings with Friends and Family: Once a week    Attends Religious Services: More than 4 times per year    Active Member of [employer] or Organizations: No    Attends Banker Meetings: Never    Marital Status: Living with partner  Intimate Partner Violence: Not At Risk (08/08/2023)   Humiliation, Afraid,  Rape, and Kick questionnaire    Fear of Current or Ex-Partner: No    Emotionally Abused: No    Physically Abused: No    Sexually Abused: No    Review of Systems  All other systems reviewed and are negative.       Objective    BP (!) 145/91 (BP Location: Right Arm, Patient Position: Sitting, Cuff Size: Large)   Pulse 86   Temp 99.1 F (37.3 C) (Oral)   Resp 18   Ht 5\' 1"  (1.549 m)   Wt 197 lb 9.6 oz (89.6 kg)   SpO2 98%   BMI 37.34 kg/m   Physical Exam Vitals and nursing note reviewed.  Constitutional:      General: She is not in acute distress. Cardiovascular:     Rate and Rhythm: Normal rate and regular rhythm.  Pulmonary:     Effort: Pulmonary effort is normal.     Breath sounds: Normal breath sounds. No wheezing.  Abdominal:     Palpations: Abdomen is soft.     Tenderness: There is no abdominal tenderness.  Musculoskeletal:     Right lower leg: No edema.     Left lower leg: No edema.  Neurological:     General: No focal deficit present.     Mental Status: She is alert and oriented to person, place, and time.         Assessment & Plan:  1. Mild persistent asthma without complication Appears stable. Ellipta refilled.  - Fluticasone Furoate (ARNUITY ELLIPTA) 100 MCG/ACT AEPB; Inhale 2 puffs into the lungs daily.  Dispense: 90 each; Refill: 1  2. Persistent cough As above - also medrol dosepak and omeprazole. Discussed dietary and activity options.   3. Essential hypertension Discussed compliance.  - hydrochlorothiazide (HYDRODIURIL) 25 MG tablet; Take 1 tablet (25 mg total) by mouth daily.  Dispense: 90 tablet; Refill: 1  4. Encounter for completion of form with patient Form completed.   5. Immunization due  - Tdap vaccine greater than or equal to 7yo IM  Return in about 3 months (around 11/08/2023) for follow up.   Tommie Raymond, MD

## 2023-08-10 ENCOUNTER — Encounter: Payer: Self-pay | Admitting: Family Medicine

## 2023-08-10 ENCOUNTER — Telehealth: Payer: Self-pay | Admitting: Family Medicine

## 2023-08-10 NOTE — Telephone Encounter (Signed)
Provider has papers at her desk. Awaiting signature

## 2023-08-10 NOTE — Telephone Encounter (Signed)
Patient dropped off document FMLA, to be filled out by provider. Patient requested to send it back via Fax within 7-days. Document is located in providers tray at front office.Please advise at  fax paper work to [phone] patient stated she came into appointment last week and provider is aware that she would be dropping this off

## 2023-08-11 ENCOUNTER — Telehealth: Payer: Self-pay | Admitting: *Deleted

## 2023-08-11 NOTE — Telephone Encounter (Signed)
done

## 2023-08-14 ENCOUNTER — Telehealth: Payer: Self-pay | Admitting: Family Medicine

## 2023-08-14 NOTE — Telephone Encounter (Signed)
I have attempted without success to contact this patient by phone to return their call and I left a message on answering machine.

## 2023-08-14 NOTE — Telephone Encounter (Signed)
Copied from CRM [id number]. Topic: General - Other >> Aug 14, 2023  2:51 PM Phill Myron wrote: Attn: Kieth Brightly, RMA  Pt Kelly Meyers calling on the status of her FMLA paper work, please advise

## 2023-08-28 ENCOUNTER — Other Ambulatory Visit: Payer: Self-pay

## 2023-09-21 DIAGNOSIS — R92323 Mammographic fibroglandular density, bilateral breasts: Secondary | ICD-10-CM | POA: Diagnosis not present

## 2023-09-21 DIAGNOSIS — Z1231 Encounter for screening mammogram for malignant neoplasm of breast: Secondary | ICD-10-CM | POA: Diagnosis not present

## 2023-10-02 ENCOUNTER — Telehealth: Payer: Self-pay | Admitting: Family Medicine

## 2023-10-02 NOTE — Telephone Encounter (Signed)
Copied from CRM [id number]. Topic: General - Other >> Sep 29, 2023  2:44 PM Phill Myron wrote: PLease call regarding FMLA paperwork, due to my illness, I have missed a couple days. The job states that I need my FMLA modified.

## 2023-10-02 NOTE — Telephone Encounter (Signed)
Called left voicemail on patients phone to call office back so we can schedule to get paperwork filled out

## 2023-10-02 NOTE — Telephone Encounter (Signed)
Kelly Meyers request patient come in for paperwork

## 2023-10-20 ENCOUNTER — Encounter (INDEPENDENT_AMBULATORY_CARE_PROVIDER_SITE_OTHER): Payer: BC Managed Care – PPO | Admitting: Family Medicine

## 2023-10-20 ENCOUNTER — Other Ambulatory Visit: Payer: Self-pay

## 2023-10-20 NOTE — Progress Notes (Signed)
Patient states FMLA needs to be updated.  States she has been havign dizzy spells.

## 2023-10-20 NOTE — Progress Notes (Signed)
Patient seen by Georganna Skeans, MD.

## 2023-11-02 ENCOUNTER — Encounter: Payer: Self-pay | Admitting: Family Medicine

## 2023-11-02 ENCOUNTER — Ambulatory Visit (INDEPENDENT_AMBULATORY_CARE_PROVIDER_SITE_OTHER): Payer: BC Managed Care – PPO | Admitting: Family Medicine

## 2023-11-02 VITALS — BP 162/98 | HR 79 | Temp 98.7°F | Resp 18 | Ht 61.0 in | Wt 197.0 lb

## 2023-11-02 DIAGNOSIS — J309 Allergic rhinitis, unspecified: Secondary | ICD-10-CM | POA: Diagnosis not present

## 2023-11-02 DIAGNOSIS — J4530 Mild persistent asthma, uncomplicated: Secondary | ICD-10-CM

## 2023-11-02 DIAGNOSIS — R42 Dizziness and giddiness: Secondary | ICD-10-CM | POA: Diagnosis not present

## 2023-11-02 MED ORDER — FLUTICASONE PROPIONATE 50 MCG/ACT NA SUSP: 2.0000 | Freq: Every day | NASAL | 2 refills | Status: AC

## 2023-11-02 MED ORDER — MECLIZINE HCL 25 MG PO TABS: 25.0000 mg | ORAL_TABLET | Freq: Three times a day (TID) | ORAL | 1 refills | Status: AC | PRN

## 2023-11-06 ENCOUNTER — Encounter: Payer: Self-pay | Admitting: Family Medicine

## 2023-11-06 NOTE — Progress Notes (Signed)
Established Patient Office Visit  Subjective    Patient ID: Kelly Meyers, female    DOB: [birth year]  Age: 48 y.o. MRN: [id number]  CC:  Chief Complaint  Patient presents with   Cough    Dizzy spells, voice issues,     HPI Kelly Meyers presents with some intermittent dizziness and congestion. Patient denies fever/chills or viral sx.   Outpatient Encounter Medications as of 11/02/2023  Medication Sig   albuterol (PROVENTIL) (2.5 MG/3ML) 0.083% nebulizer solution USE 1 VIAL VIA NEBULIZER EVERY 6 HOURS AS NEEDED FOR WHEEZING OR SHORTNESS OF BREATH   albuterol (VENTOLIN HFA) 108 (90 Base) MCG/ACT inhaler INHALE 1 TO 2 PUFFS INTO THE LUNGS EVERY 6 HOURS AS NEEDED FOR WHEEZING OR SHORTNESS OF BREATH   cetirizine (ZYRTEC) 10 MG tablet Take 1 tablet (10 mg total) by mouth daily.   fluticasone-salmeterol (ADVAIR DISKUS) 500-50 MCG/ACT AEPB Inhale 1 puff into the lungs in the morning and at bedtime.   hydrochlorothiazide (HYDRODIURIL) 25 MG tablet Take 1 tablet (25 mg total) by mouth daily.   meclizine (ANTIVERT) 25 MG tablet Take 1 tablet (25 mg total) by mouth 3 (three) times daily as needed for dizziness.   omeprazole (PRILOSEC) 20 MG capsule Take 1 capsule (20 mg total) by mouth daily.   predniSONE (DELTASONE) 50 MG tablet Take 1 tablet (50 mg total) by mouth daily with breakfast.   valsartan (DIOVAN) 320 MG tablet Take 1 tablet (320 mg total) by mouth daily.   [DISCONTINUED] fluticasone (FLONASE) 50 MCG/ACT nasal spray SHAKE LIQUID AND USE 2 SPRAYS IN EACH NOSTRIL DAILY   fluticasone (FLONASE) 50 MCG/ACT nasal spray Place 2 sprays into both nostrils daily.   Fluticasone Furoate (ARNUITY ELLIPTA) 100 MCG/ACT AEPB Inhale 2 puffs into the lungs daily. (Patient not taking: Reported on 10/20/2023)   methylPREDNISolone (MEDROL DOSEPAK) 4 MG TBPK tablet Take daily as directed (Patient not taking: Reported on 10/20/2023)   No facility-administered encounter medications on file as of  11/02/2023.    Past Medical History:  Diagnosis Date   Allergy    Anemia    Asthma    Hypertension     Past Surgical History:  Procedure Laterality Date   CESAREAN SECTION      Family History  Problem Relation Age of Onset   Diabetes Mother    Multiple sclerosis Mother    Hypertension Father    Diabetes Father     Social History   Socioeconomic History   Marital status: Single    Spouse name: Not on file   Number of children: Not on file   Years of education: Not on file   Highest education level: Not on file  Occupational History   Not on file  Tobacco Use   Smoking status: Never   Smokeless tobacco: Never  Substance and Sexual Activity   Alcohol use: Yes    Comment: occ   Drug use: No   Sexual activity: Yes    Birth control/protection: I.U.D.  Other Topics Concern   Not on file  Social History Narrative   Not on file   Social Drivers of Health   Financial Resource Strain: Low Risk  (11/02/2023)   Overall Financial Resource Strain (CARDIA)    Difficulty of Paying Living Expenses: Not hard at all  Food Insecurity: No Food Insecurity (08/08/2023)   Hunger Vital Sign    Worried About Running Out of Food in the Last Year: Never true    Ran Out of  Food in the Last Year: Never true  Transportation Needs: No Transportation Needs (08/08/2023)   PRAPARE - Administrator, Civil Service (Medical): No    Lack of Transportation (Non-Medical): No  Physical Activity: Insufficiently Active (11/02/2023)   Exercise Vital Sign    Days of Exercise per Week: 4 days    Minutes of Exercise per Session: 30 min  Stress: No Stress Concern Present (11/02/2023)   [employer] of Occupational Health - Occupational Stress Questionnaire    Feeling of Stress : Only a little  Social Connections: Moderately Integrated (08/08/2023)   Social Connection and Isolation Panel [NHANES]    Frequency of Communication with Friends and Family: Twice a week    Frequency of Social  Gatherings with Friends and Family: Once a week    Attends Religious Services: More than 4 times per year    Active Member of [employer] or Organizations: No    Attends Banker Meetings: Never    Marital Status: Living with partner  Intimate Partner Violence: Not At Risk (08/08/2023)   Humiliation, Afraid, Rape, and Kick questionnaire    Fear of Current or Ex-Partner: No    Emotionally Abused: No    Physically Abused: No    Sexually Abused: No    Review of Systems  Neurological:  Positive for dizziness.  All other systems reviewed and are negative.       Objective    BP (!) 162/98 (BP Location: Right Arm, Patient Position: Sitting, Cuff Size: Large)   Pulse 79   Temp 98.7 F (37.1 C) (Oral)   Resp 18   Ht 5\' 1"  (1.549 m)   Wt 197 lb (89.4 kg)   LMP 09/30/2023 (Exact Date)   SpO2 95%   BMI 37.22 kg/m   Physical Exam Vitals and nursing note reviewed.  Constitutional:      General: She is not in acute distress. HENT:     Head: Normocephalic and atraumatic.     Nose: Congestion present.  Cardiovascular:     Rate and Rhythm: Normal rate and regular rhythm.  Pulmonary:     Effort: Pulmonary effort is normal.     Breath sounds: Normal breath sounds.  Neurological:     General: No focal deficit present.     Mental Status: She is alert and oriented to person, place, and time.         Assessment & Plan:   Allergic rhinitis, unspecified seasonality, unspecified trigger  Mild persistent asthma without complication  Dizziness and giddiness  Other orders -     Fluticasone Propionate; Place 2 sprays into both nostrils daily.  Dispense: 16 g; Refill: 2 -     Meclizine HCl; Take 1 tablet (25 mg total) by mouth 3 (three) times daily as needed for dizziness.  Dispense: 30 tablet; Refill: 1     Return in about 6 weeks (around 12/14/2023).   Tommie Raymond, MD

## 2023-12-14 ENCOUNTER — Encounter: Payer: Self-pay | Admitting: Family Medicine

## 2023-12-14 ENCOUNTER — Ambulatory Visit: Payer: BC Managed Care – PPO | Admitting: Family Medicine

## 2023-12-14 VITALS — BP 162/99 | HR 85 | Temp 98.1°F | Resp 16 | Ht 61.0 in | Wt 200.0 lb

## 2023-12-14 DIAGNOSIS — Z13220 Encounter for screening for lipoid disorders: Secondary | ICD-10-CM | POA: Diagnosis not present

## 2023-12-14 DIAGNOSIS — Z0000 Encounter for general adult medical examination without abnormal findings: Secondary | ICD-10-CM | POA: Diagnosis not present

## 2023-12-14 DIAGNOSIS — Z1159 Encounter for screening for other viral diseases: Secondary | ICD-10-CM | POA: Diagnosis not present

## 2023-12-14 DIAGNOSIS — Z130 Encounter for screening for diseases of the blood and blood-forming organs and certain disorders involving the immune mechanism: Secondary | ICD-10-CM | POA: Diagnosis not present

## 2023-12-14 DIAGNOSIS — Z114 Encounter for screening for human immunodeficiency virus [HIV]: Secondary | ICD-10-CM

## 2023-12-14 DIAGNOSIS — Z1211 Encounter for screening for malignant neoplasm of colon: Secondary | ICD-10-CM

## 2023-12-14 DIAGNOSIS — I10 Essential (primary) hypertension: Secondary | ICD-10-CM

## 2023-12-14 MED ORDER — AMLODIPINE BESYLATE 10 MG PO TABS: 10.0000 mg | ORAL_TABLET | Freq: Every day | ORAL | 1 refills | Status: AC

## 2023-12-15 LAB — CBC WITH DIFFERENTIAL/PLATELET
Basophils Absolute: 0.1 10*3/uL (ref 0.0–0.2)
Basos: 1 %
EOS (ABSOLUTE): 0.2 10*3/uL (ref 0.0–0.4)
Eos: 2 %
Hematocrit: 43.9 % (ref 34.0–46.6)
Hemoglobin: 14.2 g/dL (ref 11.1–15.9)
Immature Grans (Abs): 0 10*3/uL (ref 0.0–0.1)
Immature Granulocytes: 0 %
Lymphocytes Absolute: 2.7 10*3/uL (ref 0.7–3.1)
Lymphs: 30 %
MCH: 29.5 pg (ref 26.6–33.0)
MCHC: 32.3 g/dL (ref 31.5–35.7)
MCV: 91 fL (ref 79–97)
Monocytes Absolute: 0.6 10*3/uL (ref 0.1–0.9)
Monocytes: 7 %
Neutrophils Absolute: 5.4 10*3/uL (ref 1.4–7.0)
Neutrophils: 60 %
Platelets: 302 10*3/uL (ref 150–450)
RBC: 4.82 x10E6/uL (ref 3.77–5.28)
RDW: 14.3 % (ref 11.7–15.4)
WBC: 9 10*3/uL (ref 3.4–10.8)

## 2023-12-15 LAB — CMP14+EGFR
ALT: 11 IU/L (ref 0–32)
AST: 14 IU/L (ref 0–40)
Albumin: 4 g/dL (ref 3.9–4.9)
Alkaline Phosphatase: 68 IU/L (ref 44–121)
BUN/Creatinine Ratio: 9 (ref 9–23)
BUN: 7 mg/dL (ref 6–24)
Bilirubin Total: 0.4 mg/dL (ref 0.0–1.2)
CO2: 21 mmol/L (ref 20–29)
Calcium: 9.1 mg/dL (ref 8.7–10.2)
Chloride: 107 mmol/L — ABNORMAL HIGH (ref 96–106)
Creatinine, Ser: 0.82 mg/dL (ref 0.57–1.00)
Globulin, Total: 2.5 g/dL (ref 1.5–4.5)
Glucose: 87 mg/dL (ref 70–99)
Potassium: 4.6 mmol/L (ref 3.5–5.2)
Sodium: 141 mmol/L (ref 134–144)
Total Protein: 6.5 g/dL (ref 6.0–8.5)
eGFR: 89 mL/min/{1.73_m2} (ref >59)

## 2023-12-15 LAB — LIPID PANEL
Chol/HDL Ratio: 2.7 ratio (ref 0.0–4.4)
Cholesterol, Total: 121 mg/dL (ref 100–199)
HDL: 45 mg/dL (ref >39)
LDL Chol Calc (NIH): 57 mg/dL (ref 0–99)
Triglycerides: 103 mg/dL (ref 0–149)
VLDL Cholesterol Cal: 19 mg/dL (ref 5–40)

## 2023-12-15 LAB — HEPATITIS C ANTIBODY: Hep C Virus Ab: NONREACTIVE

## 2023-12-15 LAB — HIV ANTIBODY (ROUTINE TESTING W REFLEX): HIV Screen 4th Generation wRfx: NONREACTIVE

## 2023-12-19 ENCOUNTER — Encounter: Payer: Self-pay | Admitting: Family Medicine

## 2023-12-19 NOTE — Progress Notes (Signed)
 Established Patient Office Visit  Subjective    Patient ID: Kelly Meyers, female    DOB: [birth year]  Age: 48 y.o. MRN: [id number]  CC:  Chief Complaint  Patient presents with   Follow-up    6 week, still coughing, allergies    HPI Kelly Meyers presents for routine annual exam. Patient reports med compliance and denies acute complaints.   Outpatient Encounter Medications as of 12/14/2023  Medication Sig   albuterol (PROVENTIL) (2.5 MG/3ML) 0.083% nebulizer solution USE 1 VIAL VIA NEBULIZER EVERY 6 HOURS AS NEEDED FOR WHEEZING OR SHORTNESS OF BREATH   albuterol (VENTOLIN HFA) 108 (90 Base) MCG/ACT inhaler INHALE 1 TO 2 PUFFS INTO THE LUNGS EVERY 6 HOURS AS NEEDED FOR WHEEZING OR SHORTNESS OF BREATH   amLODipine (NORVASC) 10 MG tablet Take 1 tablet (10 mg total) by mouth daily.   cetirizine (ZYRTEC) 10 MG tablet Take 1 tablet (10 mg total) by mouth daily.   fluticasone (FLONASE) 50 MCG/ACT nasal spray Place 2 sprays into both nostrils daily.   fluticasone-salmeterol (ADVAIR DISKUS) 500-50 MCG/ACT AEPB Inhale 1 puff into the lungs in the morning and at bedtime.   hydrochlorothiazide (HYDRODIURIL) 25 MG tablet Take 1 tablet (25 mg total) by mouth daily.   meclizine (ANTIVERT) 25 MG tablet Take 1 tablet (25 mg total) by mouth 3 (three) times daily as needed for dizziness.   omeprazole (PRILOSEC) 20 MG capsule Take 1 capsule (20 mg total) by mouth daily.   predniSONE (DELTASONE) 50 MG tablet Take 1 tablet (50 mg total) by mouth daily with breakfast.   valsartan (DIOVAN) 320 MG tablet Take 1 tablet (320 mg total) by mouth daily.   Fluticasone Furoate (ARNUITY ELLIPTA) 100 MCG/ACT AEPB Inhale 2 puffs into the lungs daily. (Patient not taking: Reported on 10/20/2023)   methylPREDNISolone (MEDROL DOSEPAK) 4 MG TBPK tablet Take daily as directed (Patient not taking: Reported on 10/20/2023)   No facility-administered encounter medications on file as of 12/14/2023.    Past Medical  History:  Diagnosis Date   Allergy    Anemia    Asthma    Hypertension     Past Surgical History:  Procedure Laterality Date   CESAREAN SECTION      Family History  Problem Relation Age of Onset   Diabetes Mother    Multiple sclerosis Mother    Hypertension Father    Diabetes Father     Social History   Socioeconomic History   Marital status: Single    Spouse name: Not on file   Number of children: Not on file   Years of education: Not on file   Highest education level: Not on file  Occupational History   Not on file  Tobacco Use   Smoking status: Never   Smokeless tobacco: Never  Substance and Sexual Activity   Alcohol use: Yes    Comment: occ   Drug use: No   Sexual activity: Yes    Birth control/protection: I.U.D.  Other Topics Concern   Not on file  Social History Narrative   Not on file   Social Drivers of Health   Financial Resource Strain: Low Risk  (11/02/2023)   Overall Financial Resource Strain (CARDIA)    Difficulty of Paying Living Expenses: Not hard at all  Food Insecurity: No Food Insecurity (08/08/2023)   Hunger Vital Sign    Worried About Running Out of Food in the Last Year: Never true    Ran Out of Food in the  Last Year: Never true  Transportation Needs: No Transportation Needs (08/08/2023)   PRAPARE - Administrator, Civil Service (Medical): No    Lack of Transportation (Non-Medical): No  Physical Activity: Insufficiently Active (11/02/2023)   Exercise Vital Sign    Days of Exercise per Week: 4 days    Minutes of Exercise per Session: 30 min  Stress: No Stress Concern Present (11/02/2023)   [employer] of Occupational Health - Occupational Stress Questionnaire    Feeling of Stress : Only a little  Social Connections: Moderately Integrated (08/08/2023)   Social Connection and Isolation Panel [NHANES]    Frequency of Communication with Friends and Family: Twice a week    Frequency of Social Gatherings with Friends and  Family: Once a week    Attends Religious Services: More than 4 times per year    Active Member of [employer] or Organizations: No    Attends Banker Meetings: Never    Marital Status: Living with partner  Intimate Partner Violence: Not At Risk (08/08/2023)   Humiliation, Afraid, Rape, and Kick questionnaire    Fear of Current or Ex-Partner: No    Emotionally Abused: No    Physically Abused: No    Sexually Abused: No    Review of Systems  All other systems reviewed and are negative.       Objective    BP (!) 162/99   Pulse 85   Temp 98.1 F (36.7 C) (Oral)   Resp 16   Ht 5\' 1"  (1.549 m)   Wt 200 lb (90.7 kg)   SpO2 98%   BMI 37.79 kg/m   Physical Exam Vitals and nursing note reviewed.  Constitutional:      General: She is not in acute distress. HENT:     Head: Normocephalic and atraumatic.     Right Ear: Tympanic membrane, ear canal and external ear normal.     Left Ear: Tympanic membrane, ear canal and external ear normal.     Nose: Nose normal.     Mouth/Throat:     Mouth: Mucous membranes are moist.     Pharynx: Oropharynx is clear.  Eyes:     Conjunctiva/sclera: Conjunctivae normal.     Pupils: Pupils are equal, round, and reactive to light.  Neck:     Thyroid: No thyromegaly.  Cardiovascular:     Rate and Rhythm: Normal rate and regular rhythm.     Heart sounds: Normal heart sounds. No murmur heard. Pulmonary:     Effort: Pulmonary effort is normal. No respiratory distress.     Breath sounds: Normal breath sounds.  Abdominal:     General: There is no distension.     Palpations: Abdomen is soft. There is no mass.     Tenderness: There is no abdominal tenderness.  Musculoskeletal:        General: Normal range of motion.     Cervical back: Normal range of motion and neck supple.  Skin:    General: Skin is warm and dry.  Neurological:     General: No focal deficit present.     Mental Status: She is alert and oriented to person, place, and  time.  Psychiatric:        Mood and Affect: Mood normal.        Behavior: Behavior normal.         Assessment & Plan:   Annual physical exam -     CMP14+EGFR  Screening for deficiency anemia -  CBC with Differential/Platelet  Screening for lipid disorders -     Lipid panel  Screening for colon cancer -     Cologuard  Screening for HIV (human immunodeficiency virus) -     HIV Antibody (routine testing w rflx)  Need for hepatitis C screening test -     Hepatitis C antibody  Uncontrolled hypertension  Other orders -     amLODIPine Besylate; Take 1 tablet (10 mg total) by mouth daily.  Dispense: 90 tablet; Refill: 1     Return in about 2 weeks (around 12/28/2023) for follow up, chronic med issues.   Tommie Raymond, MD

## 2023-12-31 DIAGNOSIS — Z1211 Encounter for screening for malignant neoplasm of colon: Secondary | ICD-10-CM | POA: Diagnosis not present

## 2024-01-04 LAB — COLOGUARD: COLOGUARD: NEGATIVE

## 2024-03-15 ENCOUNTER — Ambulatory Visit (INDEPENDENT_AMBULATORY_CARE_PROVIDER_SITE_OTHER): Admitting: Family Medicine

## 2024-03-15 ENCOUNTER — Encounter: Payer: Self-pay | Admitting: Family Medicine

## 2024-03-15 ENCOUNTER — Ambulatory Visit: Payer: Self-pay

## 2024-03-15 VITALS — BP 191/103 | HR 80 | Temp 97.5°F | Wt 197.6 lb

## 2024-03-15 DIAGNOSIS — I160 Hypertensive urgency: Secondary | ICD-10-CM

## 2024-03-15 DIAGNOSIS — E66812 Obesity, class 2: Secondary | ICD-10-CM

## 2024-03-15 DIAGNOSIS — J4531 Mild persistent asthma with (acute) exacerbation: Secondary | ICD-10-CM | POA: Diagnosis not present

## 2024-03-15 DIAGNOSIS — N951 Menopausal and female climacteric states: Secondary | ICD-10-CM | POA: Diagnosis not present

## 2024-03-15 DIAGNOSIS — Z6837 Body mass index (BMI) 37.0-37.9, adult: Secondary | ICD-10-CM

## 2024-03-15 MED ORDER — FLUTICASONE PROPIONATE HFA 220 MCG/ACT IN AERO: 2.0000 | INHALATION_SPRAY | Freq: Every day | RESPIRATORY_TRACT | 3 refills | Status: AC

## 2024-03-15 MED ORDER — SPIRONOLACTONE 50 MG PO TABS
50.0000 mg | ORAL_TABLET | Freq: Two times a day (BID) | ORAL | 0 refills | Status: DC
Start: 2024-03-15 — End: 2024-08-08

## 2024-03-15 NOTE — Telephone Encounter (Signed)
 FYI Only or Action Required?: FYI only for provider  Patient was last seen in primary care on 12/14/2023 by Abraham Abo, MD. Called Nurse Triage reporting Cough. Symptoms began several days ago. Interventions attempted: OTC medications: cough meds and Rest, hydration, or home remedies. Symptoms are: unchanged.  Triage Disposition: See HCP Within 4 Hours (Or PCP Triage)  Patient/caregiver understands and will follow disposition?: Yes  Copied from CRM [id number]. Topic: Clinical - Red Word Triage >> Mar 15, 2024  8:36 AM Tiffini S wrote: [facility] that prompted transfer to Nurse Triage: Patient called with cough, chest pain, wheezing, congestion, swollen throat for the late three to four days Reason for Disposition  [1] MILD difficulty breathing (e.g., minimal/no SOB at rest, SOB with walking, pulse <100) AND [2] still present when not coughing  Answer Assessment - Initial Assessment Questions 1. ONSET: When did the cough begin?      Started this past Sunday-Monday 2. SEVERITY: How bad is the cough today?      6 out of 10 in severity 3. SPUTUM: Describe the color of your sputum (none, dry cough; clear, white, yellow, green)     Dry cough 4. HEMOPTYSIS: Are you coughing up any blood? If so ask: How much? (flecks, streaks, tablespoons, etc.)     Flecks at times 5. DIFFICULTY BREATHING: Are you having difficulty breathing? If Yes, ask: How bad is it? (e.g., mild, moderate, severe)    - MILD: No SOB at rest, mild SOB with walking, speaks normally in sentences, can lie down, no retractions, pulse < 100.    - MODERATE: SOB at rest, SOB with minimal exertion and prefers to sit, cannot lie down flat, speaks in phrases, mild retractions, audible wheezing, pulse 100-120.    - SEVERE: Very SOB at rest, speaks in single words, struggling to breathe, sitting hunched forward, retractions, pulse > 120      Mild to moderate 6. FEVER: Do you have a fever? If Yes, ask: What is your  temperature, how was it measured, and when did it start?     no 7. CARDIAC HISTORY: Do you have any history of heart disease? (e.g., heart attack, congestive heart failure)      HTN 8. LUNG HISTORY: Do you have any history of lung disease?  (e.g., pulmonary embolus, asthma, emphysema)     asthma 9. PE RISK FACTORS: Do you have a history of blood clots? (or: recent major surgery, recent prolonged travel, bedridden)     no 10. OTHER SYMPTOMS: Do you have any other symptoms? (e.g., runny nose, wheezing, chest pain)       Chest tightness 12. TRAVEL: Have you traveled out of the country in the last month? (e.g., travel history, exposures)       no  Protocols used: Cough - Acute Non-Productive-A-AH

## 2024-03-15 NOTE — Telephone Encounter (Signed)
 Noted

## 2024-03-15 NOTE — Progress Notes (Unsigned)
 Established Patient Office Visit  Subjective    Patient ID: Kelly Meyers, female    DOB: 08-02-1976  Age: 48 y.o. MRN: [id number]  CC:  Chief Complaint  Patient presents with   Cough   Shortness of Breath    Patient has asthma    Nasal Congestion   Sore Throat    HPI KINSLEI LABINE presents follow up of chronic med issues including hypertension and asthma. She has been having increased asthma sx for the past several days.  She reports that she has also having hot flashes and other perimenopausal sx.   Outpatient Encounter Medications as of 03/15/2024  Medication Sig   albuterol  (PROVENTIL ) (2.5 MG/3ML) 0.083% nebulizer solution USE 1 VIAL VIA NEBULIZER EVERY 6 HOURS AS NEEDED FOR WHEEZING OR SHORTNESS OF BREATH   albuterol  (VENTOLIN  HFA) 108 (90 Base) MCG/ACT inhaler INHALE 1 TO 2 PUFFS INTO THE LUNGS EVERY 6 HOURS AS NEEDED FOR WHEEZING OR SHORTNESS OF BREATH   amLODipine  (NORVASC ) 10 MG tablet Take 1 tablet (10 mg total) by mouth daily.   cetirizine  (ZYRTEC ) 10 MG tablet Take 1 tablet (10 mg total) by mouth daily.   fluticasone  (FLONASE ) 50 MCG/ACT nasal spray Place 2 sprays into both nostrils daily.   fluticasone  (FLOVENT  HFA) 220 MCG/ACT inhaler Inhale 2 puffs into the lungs daily.   Fluticasone  Furoate (ARNUITY ELLIPTA ) 100 MCG/ACT AEPB Inhale 2 puffs into the lungs daily.   fluticasone -salmeterol (ADVAIR DISKUS) 500-50 MCG/ACT AEPB Inhale 1 puff into the lungs in the morning and at bedtime.   hydrochlorothiazide  (HYDRODIURIL ) 25 MG tablet Take 1 tablet (25 mg total) by mouth daily.   meclizine  (ANTIVERT ) 25 MG tablet Take 1 tablet (25 mg total) by mouth 3 (three) times daily as needed for dizziness.   methylPREDNISolone  (MEDROL  DOSEPAK) 4 MG TBPK tablet Take daily as directed   omeprazole  (PRILOSEC) 20 MG capsule Take 1 capsule (20 mg total) by mouth daily.   predniSONE  (DELTASONE ) 50 MG tablet Take 1 tablet (50 mg total) by mouth daily with breakfast.    spironolactone (ALDACTONE) 50 MG tablet Take 1 tablet (50 mg total) by mouth 2 (two) times daily.   valsartan  (DIOVAN ) 320 MG tablet Take 1 tablet (320 mg total) by mouth daily.   No facility-administered encounter medications on file as of 03/15/2024.    Past Medical History:  Diagnosis Date   Allergy    Anemia    Asthma    Hypertension     Past Surgical History:  Procedure Laterality Date   CESAREAN SECTION      Family History  Problem Relation Age of Onset   Diabetes Mother    Multiple sclerosis Mother    Hypertension Father    Diabetes Father     Social History   Socioeconomic History   Marital status: Single    Spouse name: Not on file   Number of children: Not on file   Years of education: Not on file   Highest education level: Not on file  Occupational History   Not on file  Tobacco Use   Smoking status: Never   Smokeless tobacco: Never  Substance and Sexual Activity   Alcohol use: Yes    Comment: occ   Drug use: No   Sexual activity: Yes    Birth control/protection: I.U.D.  Other Topics Concern   Not on file  Social History Narrative   Not on file   Social Drivers of Health   Financial Resource Strain: Low  Risk  (11/02/2023)   Overall Financial Resource Strain (CARDIA)    Difficulty of Paying Living Expenses: Not hard at all  Food Insecurity: No Food Insecurity (08/08/2023)   Hunger Vital Sign    Worried About Running Out of Food in the Last Year: Never true    Ran Out of Food in the Last Year: Never true  Transportation Needs: No Transportation Needs (08/08/2023)   PRAPARE - Administrator, Civil Service (Medical): No    Lack of Transportation (Non-Medical): No  Physical Activity: Insufficiently Active (11/02/2023)   Exercise Vital Sign    Days of Exercise per Week: 4 days    Minutes of Exercise per Session: 30 min  Stress: No Stress Concern Present (11/02/2023)   [employer] of Occupational Health - Occupational Stress  Questionnaire    Feeling of Stress : Only a little  Social Connections: Moderately Integrated (08/08/2023)   Social Connection and Isolation Panel    Frequency of Communication with Friends and Family: Twice a week    Frequency of Social Gatherings with Friends and Family: Once a week    Attends Religious Services: More than 4 times per year    Active Member of [employer] or Organizations: No    Attends Banker Meetings: Never    Marital Status: Living with partner  Intimate Partner Violence: Not At Risk (08/08/2023)   Humiliation, Afraid, Rape, and Kick questionnaire    Fear of Current or Ex-Partner: No    Emotionally Abused: No    Physically Abused: No    Sexually Abused: No    Review of Systems  Constitutional:  Negative for chills and fever.  Respiratory:  Positive for cough, shortness of breath and wheezing.   All other systems reviewed and are negative.       Objective    BP (!) 191/103 (BP Location: Right Arm, Patient Position: Sitting, Cuff Size: Normal)   Pulse 80   Temp (!) 97.5 F (36.4 C)   Wt 197 lb 9.6 oz (89.6 kg)   SpO2 95%   BMI 37.34 kg/m   Physical Exam Vitals and nursing note reviewed.  Constitutional:      General: She is not in acute distress.    Appearance: She is obese.   Cardiovascular:     Rate and Rhythm: Normal rate and regular rhythm.  Pulmonary:     Effort: Pulmonary effort is normal. No respiratory distress.     Breath sounds: Wheezing present.  Abdominal:     Palpations: Abdomen is soft.     Tenderness: There is no abdominal tenderness.   Musculoskeletal:     Right lower leg: No edema.     Left lower leg: No edema.   Neurological:     General: No focal deficit present.     Mental Status: She is alert and oriented to person, place, and time.         Assessment & Plan:   1. Hypertensive urgency (Primary) Elevated readings. Patient without CV sx. Spironolactone prescribed.   2. Mild persistent asthma with acute  exacerbation Flovent  inhaler prescribed.   3. Class 2 severe obesity due to excess calories with serious comorbidity and body mass index (BMI) of 37.0 to 37.9 in adult [facility]) Discussed dietary and activity options.   4. Perimenopausal Discussed options for sx improvement.   Return in about 1 week (around 03/22/2024) for follow up.   Arlo Lama, MD

## 2024-03-18 ENCOUNTER — Encounter: Payer: Self-pay | Admitting: Family Medicine

## 2024-03-18 ENCOUNTER — Telehealth: Payer: Self-pay | Admitting: *Deleted

## 2024-03-18 ENCOUNTER — Telehealth: Payer: Self-pay

## 2024-03-18 NOTE — Telephone Encounter (Signed)
 Copied from CRM [id number]. Topic: General - Other >> Mar 18, 2024  9:15 AM Baldemar Lev wrote: Reason for CRM: Pt called requesting a doctor's note for her recent visit. Needs to cover her visit Friday. And write her out of work until further notice. Pt is still symptomatic.   Please advise

## 2024-03-18 NOTE — Telephone Encounter (Signed)
 error

## 2024-03-19 ENCOUNTER — Other Ambulatory Visit: Payer: Self-pay

## 2024-03-19 ENCOUNTER — Ambulatory Visit (HOSPITAL_COMMUNITY)

## 2024-03-19 ENCOUNTER — Telehealth: Payer: Self-pay | Admitting: Family Medicine

## 2024-03-19 ENCOUNTER — Encounter (HOSPITAL_COMMUNITY): Payer: Self-pay

## 2024-03-19 ENCOUNTER — Ambulatory Visit (HOSPITAL_COMMUNITY): Admission: EM | Admit: 2024-03-19 | Discharge: 2024-03-19 | Disposition: A | Discharge status: Home / Self Care

## 2024-03-19 DIAGNOSIS — J069 Acute upper respiratory infection, unspecified: Secondary | ICD-10-CM

## 2024-03-19 MED ORDER — AZELASTINE HCL 0.1 % NA SOLN: 1.0000 | Freq: Two times a day (BID) | NASAL | 1 refills | Status: DC

## 2024-03-19 MED ORDER — PREDNISONE 20 MG PO TABS: 40.0000 mg | ORAL_TABLET | Freq: Every day | ORAL | 0 refills | Status: AC

## 2024-03-19 NOTE — Telephone Encounter (Signed)
 A document form from [facility] Claims has been faxed: FMLA, to be filled out by provider. Send document back via Fax within 7-days. Document is located in providers tray at front office.           Fax number: [phone] Pt called to confirm paperwork has been faxed on to us  and to know the status. I let pt know paperwork take 7-10 business days to get filled and faxed over. Pt's last OV was 03/15/24. Does pt need appt for paperwork? Please advise.

## 2024-03-19 NOTE — ED Triage Notes (Signed)
 Pt states cough for the past 10 days. States she saw her primary doctor last week for the same states she didn't do any testing or put her on any medications.  States she has been using her inhaler,nebulizer at home and taking OTC cold and flu medicine.

## 2024-03-19 NOTE — ED Provider Notes (Signed)
 [facility] [place]  Note:  This document was prepared using Dragon voice recognition software and may include unintentional dictation errors.  MRN: [id number] DOB: 1976-02-08  Subjective:   Kelly Meyers is a 48 y.o. female presenting for persistent dry cough x 10 days with sore throat, mucus discharge, laryngitis, and nasal congestion.  Patient reports that she saw her primary care provider in the clinic last week and was not prescribed any medication, no testing was completed, noted x-ray.  Patient states that cough is slightly improved but states that most of her concern is the severe sore throat and intermittent wheezing.  Patient states that she has been using her albuterol  inhaler and using cough and cold medication with minimal improvement.  No shortness of breath, chest pain, weakness, dizziness.  No current facility-administered medications for this encounter.  Current Outpatient Medications:    azelastine (ASTELIN) 0.1 % nasal spray, Place 1 spray into both nostrils 2 (two) times daily. Use in each nostril as directed, Disp: 30 mL, Rfl: 1   predniSONE  (DELTASONE ) 20 MG tablet, Take 2 tablets (40 mg total) by mouth daily for 5 days., Disp: 10 tablet, Rfl: 0   albuterol  (PROVENTIL ) (2.5 MG/3ML) 0.083% nebulizer solution, USE 1 VIAL VIA NEBULIZER EVERY 6 HOURS AS NEEDED FOR WHEEZING OR SHORTNESS OF BREATH, Disp: 75 mL, Rfl: 0   albuterol  (VENTOLIN  HFA) 108 (90 Base) MCG/ACT inhaler, INHALE 1 TO 2 PUFFS INTO THE LUNGS EVERY 6 HOURS AS NEEDED FOR WHEEZING OR SHORTNESS OF BREATH, Disp: 6.7 g, Rfl: 1   amLODipine  (NORVASC ) 10 MG tablet, Take 1 tablet (10 mg total) by mouth daily., Disp: 90 tablet, Rfl: 1   cetirizine  (ZYRTEC ) 10 MG tablet, Take 1 tablet (10 mg total) by mouth daily., Disp: 30 tablet, Rfl: 5   fluticasone  (FLONASE ) 50 MCG/ACT nasal spray, Place 2 sprays into both nostrils daily., Disp: 16 g, Rfl: 2   fluticasone  (FLOVENT  HFA) 220 MCG/ACT inhaler, Inhale 2 puffs  into the lungs daily., Disp: 1 each, Rfl: 3   Fluticasone  Furoate (ARNUITY ELLIPTA ) 100 MCG/ACT AEPB, Inhale 2 puffs into the lungs daily., Disp: 90 each, Rfl: 1   fluticasone -salmeterol (ADVAIR DISKUS) 500-50 MCG/ACT AEPB, Inhale 1 puff into the lungs in the morning and at bedtime., Disp: 60 each, Rfl: 1   hydrochlorothiazide  (HYDRODIURIL ) 25 MG tablet, Take 1 tablet (25 mg total) by mouth daily., Disp: 90 tablet, Rfl: 1   meclizine  (ANTIVERT ) 25 MG tablet, Take 1 tablet (25 mg total) by mouth 3 (three) times daily as needed for dizziness., Disp: 30 tablet, Rfl: 1   omeprazole  (PRILOSEC) 20 MG capsule, Take 1 capsule (20 mg total) by mouth daily., Disp: 90 capsule, Rfl: 0   spironolactone (ALDACTONE) 50 MG tablet, Take 1 tablet (50 mg total) by mouth 2 (two) times daily., Disp: 180 tablet, Rfl: 0   valsartan  (DIOVAN ) 320 MG tablet, Take 1 tablet (320 mg total) by mouth daily., Disp: 90 tablet, Rfl: 1   Allergies  Allergen Reactions   Shellfish Allergy Anaphylaxis and Itching   Onion Itching   Oxycodone Itching    Past Medical History:  Diagnosis Date   Allergy    Anemia    Asthma    Hypertension      Past Surgical History:  Procedure Laterality Date   CESAREAN SECTION      Family History  Problem Relation Age of Onset   Diabetes Mother    Multiple sclerosis Mother    Hypertension Father  Diabetes Father     Social History   Tobacco Use   Smoking status: Never   Smokeless tobacco: Never  Substance Use Topics   Alcohol use: Yes    Comment: occ   Drug use: No    ROS Refer to HPI for ROS details.  Objective:   Vitals: BP (!) 175/84 (BP Location: Right Arm)   Pulse 85   Temp 98.3 F (36.8 C) (Oral)   Resp 16   LMP 01/17/2024 (Approximate)   SpO2 98%   Physical Exam Vitals and nursing note reviewed.  Constitutional:      General: She is not in acute distress.    Appearance: Normal appearance. She is well-developed. She is not ill-appearing or  toxic-appearing.  HENT:     Head: Normocephalic and atraumatic.     Right Ear: Tympanic membrane, ear canal and external ear normal.     Left Ear: Tympanic membrane, ear canal and external ear normal.     Nose: Congestion present. No rhinorrhea.     Mouth/Throat:     Mouth: Mucous membranes are moist.     Pharynx: Oropharynx is clear. No oropharyngeal exudate or posterior oropharyngeal erythema.   Eyes:     General:        Right eye: No discharge.        Left eye: No discharge.     Extraocular Movements: Extraocular movements intact.     Conjunctiva/sclera: Conjunctivae normal.    Cardiovascular:     Rate and Rhythm: Normal rate and regular rhythm.     Heart sounds: Normal heart sounds. No murmur heard. Pulmonary:     Effort: Pulmonary effort is normal. No respiratory distress.     Breath sounds: Normal breath sounds. No stridor. No wheezing, rhonchi or rales.  Chest:     Chest wall: No tenderness.   Skin:    General: Skin is warm and dry.   Neurological:     General: No focal deficit present.     Mental Status: She is alert and oriented to person, place, and time.   Psychiatric:        Mood and Affect: Mood normal.        Behavior: Behavior normal.     Procedures  No results found for this or any previous visit (from the past 24 hours).  No results found.   Assessment and Plan :     Discharge Instructions       1. Viral URI with cough (Primary) - predniSONE  (DELTASONE ) 20 MG tablet; Take 2 tablets (40 mg total) by mouth daily for 5 days.  Dispense: 10 tablet; Refill: 0 - azelastine (ASTELIN) 0.1 % nasal spray; Place 1 spray into both nostrils 2 (two) times daily. Use in each nostril as directed  Dispense: 30 mL; Refill: 1 - Continue to use albuterol  inhaler as needed for wheezing and shortness of breath secondary to viral illness. -Continue to monitor symptoms for any change in severity if there is any escalation of current symptoms or development of new  symptoms follow-up in ER for further evaluation and management.      [name] B Doree Kuehne   Ruven Corradi, [address], [place] 03/19/24 [phone]

## 2024-03-19 NOTE — Discharge Instructions (Addendum)
  1. Viral URI with cough (Primary) - predniSONE  (DELTASONE ) 20 MG tablet; Take 2 tablets (40 mg total) by mouth daily for 5 days.  Dispense: 10 tablet; Refill: 0 - azelastine (ASTELIN) 0.1 % nasal spray; Place 1 spray into both nostrils 2 (two) times daily. Use in each nostril as directed  Dispense: 30 mL; Refill: 1 - Continue to use albuterol  inhaler as needed for wheezing and shortness of breath secondary to viral illness. -Continue to monitor symptoms for any change in severity if there is any escalation of current symptoms or development of new symptoms follow-up in ER for further evaluation and management.

## 2024-03-20 NOTE — Telephone Encounter (Signed)
 Noted, patient has appointment for paperwork to be filled out as well

## 2024-03-20 NOTE — Telephone Encounter (Signed)
 Patient has an appt on 03/21/2024

## 2024-03-20 NOTE — Telephone Encounter (Signed)
 Noted patient was called yesterday and appointment made for

## 2024-03-21 ENCOUNTER — Telehealth (INDEPENDENT_AMBULATORY_CARE_PROVIDER_SITE_OTHER): Admitting: Family Medicine

## 2024-03-21 DIAGNOSIS — Z0289 Encounter for other administrative examinations: Secondary | ICD-10-CM

## 2024-03-21 DIAGNOSIS — J4530 Mild persistent asthma, uncomplicated: Secondary | ICD-10-CM

## 2024-03-21 NOTE — Progress Notes (Unsigned)
 Virtual Visit via Video Note  I connected with Kelly Meyers on 03/21/24 at  4:00 PM EDT by a video enabled telemedicine application and verified that I am speaking with the correct person using two identifiers.  Location: Patient: [place] Provider: [place]   I discussed the limitations of evaluation and management by telemedicine and the availability of in person appointments. The patient expressed understanding and agreed to proceed.  History of Present Illness: Patient presents for FMLA completion for asthma exacerbations   Observations/Objective:   Assessment and Plan: 1. Encounter for completion of form with patient (Primary) Form completed  2. Mild persistent asthma without complication    Follow Up Instructions:    I discussed the assessment and treatment plan with the patient. The patient was provided an opportunity to ask questions and all were answered. The patient agreed with the plan and demonstrated an understanding of the instructions.   The patient was advised to call back or seek an in-person evaluation if the symptoms worsen or if the condition fails to improve as anticipated.  I provided 11 minutes of non-face-to-face time during this encounter.   Arlo Lama, MD

## 2024-03-22 ENCOUNTER — Encounter: Payer: Self-pay | Admitting: Family Medicine

## 2024-05-13 ENCOUNTER — Ambulatory Visit: Payer: Self-pay

## 2024-05-13 NOTE — Telephone Encounter (Signed)
 FYI Only or Action Required?: Action required by provider: request for appointment.  Patient was last seen in primary care on 03/21/2024 by Tanda Bleacher, MD.  Called Nurse Triage reporting Hoarse.  Symptoms began several months ago.  Interventions attempted: Rest, hydration, or home remedies.  Symptoms are: unchanged.  Triage Disposition: See PCP When Office is Open (Within 3 Days)-needs a follow up call for an appointment  Patient/caregiver understands and will follow disposition?: No, wishes to speak with PCP  Copied from CRM #[id number]. 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 becoming consistent, strain on the throat. prone to catching cold. even when not having asthma flare up still having voice issues. possible swelling of lymphnodes. Strain on throat of talking Reason for Disposition  [1] MODERATE to SEVERE hoarseness (e.g., interferes with work) AND [2] professional voice user (e.g., Lobbyist, singer, Runner, broadcasting/film/video)  (Exception: Current common cold or mild URI symptoms.)  Answer Assessment - Initial Assessment Questions 1. DESCRIPTION: Describe your voice. (e.g., coarse, raspy, weaker, airy, scratchy, deeper)     Hoarseness of her voice-raspy, weaker 2. SEVERITY: How bad is it?     6 out of 10 3. ONSET: When did the hoarseness begin?     Started last year and increased throughout the months.  4. COUGH: Is there a cough? If Yes, ask: How bad is it?     no 5. FEVER: Do you have a fever? If Yes, ask: What is your temperature, how was it measured, and when did it start?     no 6. ALLERGIES: Do you have any allergy symptoms? If Yes, ask: What are they? (e.g., nose stuffiness)     Nose stuffiness and drainage 7. IRRITANTS: Do you smoke? Have you been exposed to any irritating fumes? (e.g.,  smoke)     Smokes black and milds 8. CAUSE: What do you think is causing the hoarseness?     unsure 9. OTHER SYMPTOMS: Do you have any other symptoms? (e.g., breathing difficulty, fever, foreign body, lymph node swelling in neck, rash, sore throat, swallowing difficulty, weight loss)     Shortness of breath at times 10. PREGNANCY: Is there any chance you are pregnant? When was your last menstrual period?       no  Protocols used: [facility]

## 2024-05-14 NOTE — Telephone Encounter (Signed)
 Pt scheduled and added to wait list

## 2024-05-30 ENCOUNTER — Ambulatory Visit (INDEPENDENT_AMBULATORY_CARE_PROVIDER_SITE_OTHER): Admitting: Family Medicine

## 2024-05-30 ENCOUNTER — Encounter: Payer: Self-pay | Admitting: Family Medicine

## 2024-05-30 VITALS — BP 159/92 | HR 89 | Ht 61.0 in | Wt 200.4 lb

## 2024-05-30 DIAGNOSIS — Z0289 Encounter for other administrative examinations: Secondary | ICD-10-CM | POA: Diagnosis not present

## 2024-05-30 DIAGNOSIS — R490 Dysphonia: Secondary | ICD-10-CM

## 2024-05-30 DIAGNOSIS — R070 Pain in throat: Secondary | ICD-10-CM

## 2024-05-30 DIAGNOSIS — Z87891 Personal history of nicotine dependence: Secondary | ICD-10-CM

## 2024-05-30 DIAGNOSIS — I10 Essential (primary) hypertension: Secondary | ICD-10-CM | POA: Diagnosis not present

## 2024-05-30 NOTE — Progress Notes (Unsigned)
 Established Patient Office Visit  Subjective    Patient ID: Kelly Meyers, female    DOB: [birth year]  Age: 48 y.o. MRN: [id number]  CC:  Chief Complaint  Patient presents with   voice issues    Pt reports she has been having issues with her throat hurting and voice going hoarse. She feels she has to strain her throat to talk. She reports it has been happening for a couple months now and it comes and goes. She is concerned about possible swelling on her lymph nodes. This is staring to affect her job and she is needing an FMLA form filled out.     HPI Kelly Meyers presents with complaint of hoarseness and throat pain for several months. She reports intermittent exacerbations. She does work where she has to talk on the phone all day and she has had difficulty some days being able to perform her job. She also would like a FMLA form completed.   Outpatient Encounter Medications as of 05/30/2024  Medication Sig   albuterol  (PROVENTIL ) (2.5 MG/3ML) 0.083% nebulizer solution USE 1 VIAL VIA NEBULIZER EVERY 6 HOURS AS NEEDED FOR WHEEZING OR SHORTNESS OF BREATH   albuterol  (VENTOLIN  HFA) 108 (90 Base) MCG/ACT inhaler INHALE 1 TO 2 PUFFS INTO THE LUNGS EVERY 6 HOURS AS NEEDED FOR WHEEZING OR SHORTNESS OF BREATH   amLODipine  (NORVASC ) 10 MG tablet Take 1 tablet (10 mg total) by mouth daily.   azelastine  (ASTELIN ) 0.1 % nasal spray Place 1 spray into both nostrils 2 (two) times daily. Use in each nostril as directed   cetirizine  (ZYRTEC ) 10 MG tablet Take 1 tablet (10 mg total) by mouth daily.   fluticasone  (FLONASE ) 50 MCG/ACT nasal spray Place 2 sprays into both nostrils daily.   hydrochlorothiazide  (HYDRODIURIL ) 25 MG tablet Take 1 tablet (25 mg total) by mouth daily.   meclizine  (ANTIVERT ) 25 MG tablet Take 1 tablet (25 mg total) by mouth 3 (three) times daily as needed for dizziness.   valsartan  (DIOVAN ) 320 MG tablet Take 1 tablet (320 mg total) by mouth daily.   fluticasone  (FLOVENT   HFA) 220 MCG/ACT inhaler Inhale 2 puffs into the lungs daily. (Patient not taking: Reported on 05/30/2024)   Fluticasone  Furoate (ARNUITY ELLIPTA ) 100 MCG/ACT AEPB Inhale 2 puffs into the lungs daily. (Patient not taking: Reported on 05/30/2024)   fluticasone -salmeterol (ADVAIR DISKUS) 500-50 MCG/ACT AEPB Inhale 1 puff into the lungs in the morning and at bedtime. (Patient not taking: Reported on 05/30/2024)   omeprazole  (PRILOSEC) 20 MG capsule Take 1 capsule (20 mg total) by mouth daily. (Patient not taking: Reported on 05/30/2024)   spironolactone  (ALDACTONE ) 50 MG tablet Take 1 tablet (50 mg total) by mouth 2 (two) times daily. (Patient not taking: Reported on 05/30/2024)   No facility-administered encounter medications on file as of 05/30/2024.    Past Medical History:  Diagnosis Date   Allergy    Anemia    Asthma    Hypertension     Past Surgical History:  Procedure Laterality Date   CESAREAN SECTION      Family History  Problem Relation Age of Onset   Diabetes Mother    Multiple sclerosis Mother    Hypertension Father    Diabetes Father     Social History   Socioeconomic History   Marital status: Single    Spouse name: Not on file   Number of children: Not on file   Years of education: Not on file   Highest  education level: Not on file  Occupational History   Not on file  Tobacco Use   Smoking status: Never   Smokeless tobacco: Never  Substance and Sexual Activity   Alcohol use: Yes    Comment: occ   Drug use: No   Sexual activity: Yes    Birth control/protection: I.U.D.  Other Topics Concern   Not on file  Social History Narrative   Not on file   Social Drivers of Health   Financial Resource Strain: Low Risk  (11/02/2023)   Overall Financial Resource Strain (CARDIA)    Difficulty of Paying Living Expenses: Not hard at all  Food Insecurity: No Food Insecurity (08/08/2023)   Hunger Vital Sign    Worried About Running Out of Food in the Last Year: Never true     Ran Out of Food in the Last Year: Never true  Transportation Needs: No Transportation Needs (08/08/2023)   PRAPARE - Administrator, Civil Service (Medical): No    Lack of Transportation (Non-Medical): No  Physical Activity: Insufficiently Active (11/02/2023)   Exercise Vital Sign    Days of Exercise per Week: 4 days    Minutes of Exercise per Session: 30 min  Stress: No Stress Concern Present (11/02/2023)   [employer] of Occupational Health - Occupational Stress Questionnaire    Feeling of Stress : Only a little  Social Connections: Moderately Integrated (08/08/2023)   Social Connection and Isolation Panel    Frequency of Communication with Friends and Family: Twice a week    Frequency of Social Gatherings with Friends and Family: Once a week    Attends Religious Services: More than 4 times per year    Active Member of [employer] or Organizations: No    Attends Banker Meetings: Never    Marital Status: Living with partner  Intimate Partner Violence: Not At Risk (08/08/2023)   Humiliation, Afraid, Rape, and Kick questionnaire    Fear of Current or Ex-Partner: No    Emotionally Abused: No    Physically Abused: No    Sexually Abused: No    Review of Systems  All other systems reviewed and are negative.       Objective    BP (!) 159/92   Pulse 89   Ht 5' 1 (1.549 m)   Wt 200 lb 6.4 oz (90.9 kg)   LMP 05/03/2024 (Approximate)   SpO2 97%   BMI 37.87 kg/m   Physical Exam Vitals and nursing note reviewed.  Constitutional:      General: She is not in acute distress. Cardiovascular:     Rate and Rhythm: Normal rate and regular rhythm.  Pulmonary:     Effort: Pulmonary effort is normal.     Breath sounds: Normal breath sounds.  Abdominal:     Palpations: Abdomen is soft.     Tenderness: There is no abdominal tenderness.  Neurological:     General: No focal deficit present.     Mental Status: She is alert and oriented to person, place, and  time.         Assessment & Plan:   Hoarseness of voice -     Ambulatory referral to ENT  Throat pain -     Ambulatory referral to ENT  Uncontrolled hypertension  Encounter for completion of form with patient  Stopped smoking more than 1 year ago   FMLA form completed   No follow-ups on file.   Tanda Raguel SQUIBB, MD

## 2024-05-31 ENCOUNTER — Encounter: Payer: Self-pay | Admitting: Family Medicine

## 2024-05-31 ENCOUNTER — Telehealth: Payer: Self-pay

## 2024-05-31 NOTE — Telephone Encounter (Signed)
 FMLA paperwork completed and signed by Dr Tanda and faxed to requested fax number.

## 2024-06-17 ENCOUNTER — Ambulatory Visit: Admitting: Family Medicine

## 2024-07-04 ENCOUNTER — Ambulatory Visit (INDEPENDENT_AMBULATORY_CARE_PROVIDER_SITE_OTHER): Admitting: Otolaryngology

## 2024-07-04 VITALS — Ht 71.0 in | Wt 195.0 lb

## 2024-07-04 DIAGNOSIS — R490 Dysphonia: Secondary | ICD-10-CM

## 2024-07-04 DIAGNOSIS — J381 Polyp of vocal cord and larynx: Secondary | ICD-10-CM

## 2024-07-04 DIAGNOSIS — Z720 Tobacco use: Secondary | ICD-10-CM

## 2024-07-04 DIAGNOSIS — K219 Gastro-esophageal reflux disease without esophagitis: Secondary | ICD-10-CM | POA: Diagnosis not present

## 2024-07-04 DIAGNOSIS — R0982 Postnasal drip: Secondary | ICD-10-CM

## 2024-07-04 DIAGNOSIS — F17210 Nicotine dependence, cigarettes, uncomplicated: Secondary | ICD-10-CM | POA: Diagnosis not present

## 2024-07-04 DIAGNOSIS — R0981 Nasal congestion: Secondary | ICD-10-CM

## 2024-07-04 DIAGNOSIS — J3089 Other allergic rhinitis: Secondary | ICD-10-CM | POA: Diagnosis not present

## 2024-07-04 MED ORDER — LEVOCETIRIZINE DIHYDROCHLORIDE 5 MG PO TABS: 5.0000 mg | ORAL_TABLET | Freq: Every evening | ORAL | 3 refills | Status: AC

## 2024-07-04 MED ORDER — FAMOTIDINE 20 MG PO TABS: 20.0000 mg | ORAL_TABLET | Freq: Two times a day (BID) | ORAL | 1 refills | Status: AC

## 2024-07-04 MED ORDER — FLUTICASONE PROPIONATE 50 MCG/ACT NA SUSP: 2.0000 | Freq: Every day | NASAL | 6 refills | Status: AC

## 2024-07-04 NOTE — Patient Instructions (Signed)
 GamingLesson.nl - check out this website to learn more about reflux   -Avoid lying down for at least two hours after a meal or after drinking acidic beverages, like soda, or other caffeinated beverages. This can help to prevent stomach contents from flowing back into the esophagus. -Keep your head elevated while you sleep. Using an extra pillow or two can also help to prevent reflux. -Eat smaller and more frequent meals each day instead of a few large meals. This promotes digestion and can aid in preventing heartburn. -Wear loose-fitting clothes to ease pressure on the stomach, which can worsen heartburn and reflux. -Reduce excess weight around the midsection. This can ease pressure on the stomach. Such pressure can force some stomach contents back up the esophagus   - Take Reflux Gourmet (natural supplement available on Amazon) to help with symptoms of chronic throat irritation      Aureliano Med Nasal Saline Rinse   - start nasal saline rinses with NeilMed Bottle available over the counter or online to help with nasal congestion      Dear Kelly Meyers,   Congratulations for your interest in quitting smoking!  Find a program that suits you best: when you want to quit, how you need support, where you live, and how you like to learn.    If you're ready to get started TODAY, consider scheduling a visit through [email or web address].  Appointments are available from 8am to 8pm, Monday to Friday.   Most health insurance plans will cover some level of tobacco cessation visits and medications.    Additional Resources: [employer] are also available to help you quit & provide the support you'll need. Many programs are available in both [place] and Spanish and have a long history of successfully helping people get off and stay off tobacco.    Quit Smoking Apps:  quitSTART at SeriousBroker.de QuitGuide?at  ForgetParking.dk Online education and resources: Smokefree  at [employer] Free Telephone Coaching: QuitNow,  Call 1-800-QUIT-NOW (669 416 2944) or Text- Ready to 218 268 5900 *Quitline [place] has teamed up with Medicaid to offer a free 14 week program    Vaping- Want to Quit? Free 24/7 support. Call [facility]  [place], [place], [place], [place], [place]  [facility]

## 2024-07-04 NOTE — Progress Notes (Addendum)
 ENT CONSULT:  Reason for Consult: chronic hoarseness x 9 months   HPI: Discussed the use of AI scribe software for clinical note transcription with the patient, who gave verbal consent to proceed.  History of Present Illness Kelly Meyers is a 48 year old female who presents with persistent hoarseness and voice loss.   She has experienced sporadic hoarseness since the beginning of the year, with consistent symptoms for over a month. She sometimes has complete voice loss upon waking. Her job in customer service requires extensive talking, which is significantly impacted by her condition. She describes the sensation as a strain rather than pain, feeling worn out and needing to apply pressure to speak.  No history of heartburn or reflux. She reports significant issues with allergies and postnasal drainage. She has asthma and experiences congestion and cough. She feels fluid pressure behind her ears, particularly upon waking, which she attributes to drainage. The pressure is more pronounced on the side she sleeps on but is present behind both ears.  She has a history of smoking but has not smoked in the last few months. She uses over-the-counter allergy medications but has not had any prescribed recently. She has tried nasal sprays and uses a humidifier daily but reports no relief from her symptoms.   Records Reviewed:  PCP office visit 05/30/24 Kelly Meyers presents with complaint of hoarseness and throat pain for several months. She reports intermittent exacerbations. She does work where she has to talk on the phone all day and she has had difficulty some days being able to perform her job. She also would like a FMLA form completed.   Hoarseness of voice -     Ambulatory referral to ENT   Throat pain -     Ambulatory referral to ENT    Past Medical History:  Diagnosis Date   Allergy    Anemia    Asthma    Hypertension     Past Surgical History:  Procedure Laterality  Date   CESAREAN SECTION      Family History  Problem Relation Age of Onset   Diabetes Mother    Multiple sclerosis Mother    Hypertension Father    Diabetes Father     Social History: currently smokes, admits to occasional alcohol, no drug use   Allergies:  Allergies  Allergen Reactions   Shellfish Allergy Anaphylaxis and Itching   Onion Itching   Oxycodone Itching    Medications: I have reviewed the patient's current medications.  The PMH, PSH, Medications, Allergies, and SH were reviewed and updated.  ROS: Constitutional: Negative for fever, weight loss and weight gain. Cardiovascular: Negative for chest pain and dyspnea on exertion. Respiratory: Is not experiencing shortness of breath at rest. Gastrointestinal: Negative for nausea and vomiting. Neurological: Negative for headaches. Psychiatric: The patient is not nervous/anxious  Height 5' 11 (1.803 m), weight 195 lb (88.5 kg). Body mass index is 27.2 kg/m.  PHYSICAL EXAM:  Exam: General: Well-developed, well-nourished Communication and Voice: Clear pitch and clarity Respiratory Respiratory effort: Equal inspiration and expiration without stridor Cardiovascular Peripheral Vascular: Warm extremities with equal color/perfusion Eyes: No nystagmus with equal extraocular motion bilaterally Neuro/Psych/Balance: Patient oriented to person, place, and time; Appropriate mood and affect; Gait is intact with no imbalance; Cranial nerves I-XII are intact Head and Face Inspection: Normocephalic and atraumatic without mass or lesion Palpation: Facial skeleton intact without bony stepoffs Salivary Glands: No mass or tenderness Facial Strength: Facial motility symmetric and full bilaterally ENT  Pinna: External ear intact and fully developed External canal: Canal is patent with intact skin Tympanic Membrane: Clear and mobile External Nose: No scar or anatomic deformity Internal Nose: Septum is straight. No polyp, or  purulence. Mucosal edema and erythema present.  Bilateral inferior turbinate hypertrophy.  Lips, Teeth, and gums: Mucosa and teeth intact and viable TMJ: No pain to palpation with full mobility Oral cavity/oropharynx: No erythema or exudate, no lesions present Nasopharynx: No mass or lesion with intact mucosa Hypopharynx: Intact mucosa without pooling of secretions Larynx Glottic: Full true vocal cord mobility with a large Reinke's polyp on the right side Supraglottic: Normal appearing epiglottis and AE folds Interarytenoid Space: Moderate pachydermia&edema Subglottic Space: Patent without lesion or edema Neck Neck and Trachea: Midline trachea without mass or lesion Thyroid : No mass or nodularity Lymphatics: No lymphadenopathy  Procedure:  Preoperative diagnosis: hoarseness  Postoperative diagnosis:   same + large R Reinke's polyp GERD LPR post-nasal drainage  Procedure: Flexible fiberoptic laryngoscopy with stroboscopy (68420)   Surgeon: Ab Leaming, MD  Anesthesia: Topical lidocaine and Afrin  Complications: None  Condition is stable throughout exam  Indications and consent:   The patient presents to the clinic with hoarseness. All the risks, benefits, and potential complications were reviewed with the patient preoperatively and informed verbal consent was obtained.  Procedure: The patient was seated upright in the exam chair.   Topical lidocaine and Afrin were applied to the nasal cavity. After adequate anesthesia had occurred, the flexible telescope with strobe capabilities was passed into the nasal cavity. The nasopharynx was patent without mass or lesion. The scope was passed behind the soft palate and directed toward the base of tongue. The base of tongue was visualized and was symmetric with no apparent masses or abnormal appearing tissue. There were no signs of a mass or pooling of secretions in the piriform sinuses. The supraglottic structures were normal.  The true  vocal cords are mobile. The medial edges were with a large polyp on the right side. Closure was incomplete. Periodicity present. The mucosal wave and amplitude were decreased on the right along a large Reinke's polyp which involved entire length of the VF. There is moderate interarytenoid pachydermia and post cricoid edema.   The laryngoscope was then slowly withdrawn and the patient tolerated the procedure well. There were no complications or blood loss.  Studies Reviewed: CXR 05/26/2017 FINDINGS: The heart size and mediastinal contours are within normal limits. Both lungs are clear. The visualized skeletal structures are unremarkable.   IMPRESSION: No active cardiopulmonary disease.  Assessment/Plan: Encounter Diagnoses  Name Primary?   Chronic GERD Yes   Chronic nasal congestion    Environmental and seasonal allergies    Post-nasal drip    Dysphonia    Reinke's edema of vocal folds    Vocal cord polyp     Assessment and Plan Assessment & Plan Right vocal cord Reinke's polyp, large with decreased mucosal wave and glottic insufficiency Chronic hoarseness and voice loss due to large Reinke's polyp on right vocal cord, associated with smoking. She is trying to cut down on smoking currently  - Recommend smoking cessation to prevent recurrence. - Schedule outpatient laser excision surgery. - Informed about potential post-surgery voice changes and expected improvement in 1-2 months  Chronic nasal congestion with eustachian tube dysfunction environmental allergies Nasal congestion, postnasal drainage, and ear pressure likely due to eustachian tube dysfunction, exacerbated by asthma and allergies. - Prescribed Flonase . - Prescribed Xyzal. - Recommend trial of Pepcid and salt  water gargles. - Consider Reflux Gourmet supplement. - Consider nasal saline rinses.  GERD LPR - Pepcid 20 mg BID  -  Reflux Gourmet after meals - diet and lifestyle changes to minimize GERD - Refer to [employer] blog for dietary and lifestyle modifications/reflux cook book  Tobacco use disorder.  We had an extensive discussion about detrimental effects of smoking on overall health. I provided resources available at [facility] to assist with smoking cessation. I spent 4 min on counseling  - Advised smoking cessation      Thank you for allowing me to participate in the care of this patient. Please do not hesitate to contact me with any questions or concerns.   Elena Larry, MD Otolaryngology [facility] Phone: [phone] Fax: [phone]    07/04/2024, 2:33 PM

## 2024-07-10 ENCOUNTER — Telehealth (INDEPENDENT_AMBULATORY_CARE_PROVIDER_SITE_OTHER): Payer: Self-pay | Admitting: Otolaryngology

## 2024-07-10 NOTE — Telephone Encounter (Signed)
 Patient returned your call regarding scheduling her outpatient procedure.  Please call her at [phone].  She stated that best time to reach her was 2-3 pm today.

## 2024-07-26 NOTE — Progress Notes (Signed)
 Kelly Meyers                                          MRN: [id number]   07/26/2024   The [facility] Quality Team Specialist reviewed this patient medical record for the purposes of chart review for care gap closure. The following were reviewed: chart review for care gap closure-controlling blood pressure.    [facility]BCI Quality Team

## 2024-08-08 ENCOUNTER — Encounter (INDEPENDENT_AMBULATORY_CARE_PROVIDER_SITE_OTHER): Payer: Self-pay

## 2024-08-08 ENCOUNTER — Ambulatory Visit (INDEPENDENT_AMBULATORY_CARE_PROVIDER_SITE_OTHER): Payer: Self-pay

## 2024-08-08 VITALS — BP 167/100 | HR 74 | Temp 97.4°F | Ht 61.0 in | Wt 198.0 lb

## 2024-08-08 DIAGNOSIS — Z720 Tobacco use: Secondary | ICD-10-CM

## 2024-08-08 DIAGNOSIS — J381 Polyp of vocal cord and larynx: Secondary | ICD-10-CM

## 2024-08-08 DIAGNOSIS — R490 Dysphonia: Secondary | ICD-10-CM | POA: Diagnosis not present

## 2024-08-08 MED ORDER — SALINE SPRAY 0.65 % NA SOLN: 1.0000 | Freq: Three times a day (TID) | NASAL | 0 refills | Status: AC

## 2024-08-08 MED ORDER — NICOTINE POLACRILEX 4 MG MT LOZG: LOZENGE | OROMUCOSAL | 0 refills | Status: AC

## 2024-08-08 MED ORDER — NICOTINE POLACRILEX 2 MG MT GUM: CHEWING_GUM | OROMUCOSAL | 0 refills | Status: AC

## 2024-08-08 MED ORDER — NICOTINE 7 MG/24HR TD PT24: MEDICATED_PATCH | TRANSDERMAL | 0 refills | Status: DC

## 2024-08-09 MED ORDER — OMEPRAZOLE 20 MG PO CPDR: 40.0000 mg | DELAYED_RELEASE_CAPSULE | Freq: Every day | ORAL | 1 refills | Status: AC

## 2024-08-09 NOTE — Progress Notes (Signed)
 Dear Dr. Tanda, Here is my assessment for our mutual patient, Kelly Meyers. Thank you for allowing me the opportunity to care for your patient. Please do not hesitate to contact me should you have any other questions. Sincerely, Dr. Penne Croak  Otolaryngology Clinic Note Referring provider: Dr. Tanda HPI:  Discussed the use of AI scribe software for clinical note transcription with the patient, who gave verbal consent to proceed.  History of Present Illness Kelly Meyers is a 48 year old female with asthma who presents with a hoarse voice.  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, which requires extensive phone communication - Experiences vocal strain and discomfort related to occupational voice use  Throat pain - Occasional throat pain, not a major concern - Throat pain appears to correlate with weather changes  Aural fullness - Sensation of fluid build-up behind the ears, particularly noticeable when blowing her nose  Asthma and upper airway management - History of asthma; specific medications not detailed - Uses Flonase  less frequently than recommended due to nasal dryness - Uses a humidifier at home to alleviate dryness  Tobacco use - Smokes Black and Mild cigars, currently about one per day - Attempting to quit and has reduced smoking from a previously higher amount   Independent Review of Additional Tests or Records:  Reviewed external note from referring PCP, Wilson,describing relevant history incorporated into today's evaluation. I personally reviewed Dr. Emerson note.   PMH/Meds/All/SocHx/FamHx/ROS:   Past Medical History:  Diagnosis Date   Allergy    Anemia    Asthma    Hypertension      Past Surgical History:  Procedure Laterality Date   CESAREAN SECTION      Family History  Problem Relation Age of Onset    Diabetes Mother    Multiple sclerosis Mother    Hypertension Father    Diabetes Father      Social Connections: Moderately Integrated (08/08/2023)   Social Connection and Isolation Panel    Frequency of Communication with Friends and Family: Twice a week    Frequency of Social Gatherings with Friends and Family: Once a week    Attends Religious Services: More than 4 times per year    Active Member of [employer] or Organizations: No    Attends Engineer, Structural: Never    Marital Status: Living with partner      Current Outpatient Medications:    albuterol  (PROVENTIL ) (2.5 MG/3ML) 0.083% nebulizer solution, USE 1 VIAL VIA NEBULIZER EVERY 6 HOURS AS NEEDED FOR WHEEZING OR SHORTNESS OF BREATH, Disp: 75 mL, Rfl: 0   albuterol  (VENTOLIN  HFA) 108 (90 Base) MCG/ACT inhaler, INHALE 1 TO 2 PUFFS INTO THE LUNGS EVERY 6 HOURS AS NEEDED FOR WHEEZING OR SHORTNESS OF BREATH, Disp: 6.7 g, Rfl: 1   amLODipine  (NORVASC ) 10 MG tablet, Take 1 tablet (10 mg total) by mouth daily., Disp: 90 tablet, Rfl: 1   famotidine (PEPCID) 20 MG tablet, Take 1 tablet (20 mg total) by mouth 2 (two) times daily. (Patient not taking: Reported on 08/08/2024), Disp: 30 tablet, Rfl: 1   fluticasone  (FLONASE ) 50 MCG/ACT nasal spray, Place 2 sprays into both nostrils daily., Disp: 16 g, Rfl: 2   fluticasone  (FLONASE ) 50 MCG/ACT nasal spray, Place 2 sprays into both nostrils daily., Disp: 16 g, Rfl: 6   fluticasone  (FLOVENT  HFA) 220 MCG/ACT inhaler, Inhale 2 puffs into  the lungs daily. (Patient not taking: Reported on 08/08/2024), Disp: 1 each, Rfl: 3   Fluticasone  Furoate (ARNUITY ELLIPTA ) 100 MCG/ACT AEPB, Inhale 2 puffs into the lungs daily., Disp: 90 each, Rfl: 1   fluticasone -salmeterol (ADVAIR DISKUS) 500-50 MCG/ACT AEPB, Inhale 1 puff into the lungs in the morning and at bedtime., Disp: 60 each, Rfl: 1   hydrochlorothiazide  (HYDRODIURIL ) 25 MG tablet, Take 1 tablet (25 mg total) by mouth daily., Disp: 90 tablet, Rfl:  1   levocetirizine (XYZAL ALLERGY 24HR) 5 MG tablet, Take 1 tablet (5 mg total) by mouth every evening., Disp: 30 tablet, Rfl: 3   meclizine  (ANTIVERT ) 25 MG tablet, Take 1 tablet (25 mg total) by mouth 3 (three) times daily as needed for dizziness. (Patient not taking: Reported on 08/08/2024), Disp: 30 tablet, Rfl: 1   nicotine (NICODERM CQ - DOSED IN MG/24 HR) 7 mg/24hr patch, RX #2 Weeks 7-8: 7 mg x 1 patch dailyWear for 24 hours. If you have sleep disturbances, remove at bedtime.. (Patient not taking: Reported on 08/08/2024), Disp: 14 patch, Rfl: 0   nicotine polacrilex (COMMIT) 4 MG lozenge, RX #3 Weeks 10-12: 1 lozenge every 4-8 hours.Max 20 lozenges per day. (Patient not taking: Reported on 08/08/2024), Disp: 126 lozenge, Rfl: 0   nicotine polacrilex (NICORETTE) 2 MG gum, RX #3 Weeks 10-12: 1 piece every 4-8 hours.Max 24 pieces per day. (Patient not taking: Reported on 08/08/2024), Disp: 126 each, Rfl: 0   omeprazole  (PRILOSEC) 20 MG capsule, Take 1 capsule (20 mg total) by mouth daily., Disp: 90 capsule, Rfl: 0   sodium chloride (OCEAN) 0.65 % SOLN nasal spray, Place 1 spray into both nostrils in the morning, at noon, and at bedtime., Disp: 30 mL, Rfl: 0   valsartan  (DIOVAN ) 320 MG tablet, Take 1 tablet (320 mg total) by mouth daily., Disp: 90 tablet, Rfl: 1   Colloidal Oatmeal (ECZEMA MOISTURIZING EX), Apply 1 Application topically daily. Eczema Honey, Disp: , Rfl:    Physical Exam:   BP (!) 167/100 (BP Location: Left Arm, Patient Position: Sitting, Cuff Size: Normal) Comment: Pt dis not take am PB meds  Pulse 74   Temp (!) 97.4 F (36.3 C) (Oral)   Ht 5' 1 (1.549 m)   Wt 198 lb (89.8 kg)   SpO2 97%   BMI 37.41 kg/m   The patient was awake, alert, and appropriate. The external ears were inspected, and otoscopy was performed to evaluate the external auditory canals and tympanic membranes. The nasal cavity and septum were examined for mucosal changes, obstruction, or discharge. The oral  cavity and oropharynx were inspected for mucosal lesions, infection, or tonsillar hypertrophy. The neck was palpated for lymphadenopathy, thyroid  abnormalities, or other masses. Cranial nerve function was grossly intact.  Pertinent Findings: Physical Exam HEENT: No fluid in ears bilaterally. Throat normal. Nasal passages tight bilaterally. Nasal breathing intact.   Seprately Identifiable Procedures:  I personally ordered, reviewed and interpreted the following with the patient today  Procedure Note Pre-procedure diagnosis:  Dysphonia  Post-procedure diagnosis: Same, nasal polyp Procedure: Transnasal Fiberoptic Laryngoscopy, CPT 31575 - Mod 25 Indication: 37F with dysphonia and vocal fold polyp. Performed for surgical planning Complications: None apparent EBL: 0 mL  The procedure was undertaken to further evaluate the patient's complaint of dysphonia, with mirror exam inadequate for appropriate examination due to gag reflex and poor patient tolerance  Procedure:  Patient was identified as correct patient. Verbal consent was obtained. The nose was sprayed with oxymetazoline and 4% lidocaine.  The flexible laryngoscope was passed through the nose to view the nasal cavity, pharynx (oropharynx, hypopharynx) and larynx.  The larynx was examined at rest and during multiple phonatory tasks. Documentation was obtained and reviewed with patient. The scope was removed. The patient tolerated the procedure well.  Findings: The nasal cavity and nasopharynx did not reveal any masses or lesions, mucosa appeared to be without obvious lesions. The tongue base, pharyngeal walls, piriform sinuses, vallecula, epiglottis and postcricoid region are normal in appearance EXCEPT: right vocal fold polyp, mildly ball valving. The visualized portion of the subglottis and proximal trachea is widely patent. The vocal folds are mobile bilaterally. There are no lesions on the free edge of the vocal folds nor elsewhere in the  larynx worrisome for malignancy.    Electronically signed by: Penne Croak, DO 08/09/2024 6:17 PM        Impression & Plans:  Kelly Meyers is a 47 y.o. female  1. Hoarseness of voice   2. Reinke's edema of vocal folds   3. Vocal cord polyp   4. Dysphonia   5. Tobacco abuse     - Findings and diagnoses discussed in detail with the patient. - Risks, benefits, and alternatives were reviewed. Through shared decision making, the patient elects to proceed with below. Assessment & Plan Vocal cord polyp with dysphonia Chronic hoarseness with vocal cord polyp likely due to smoking. Cancer unlikely. Surgery planned for polyp removal. - Scheduled outpatient surgery for vocal cord polyp removal. - Advised voice rest post-surgery. - Recommended speech therapy to strengthen voice and protect it post-surgery. - Increased Prilosec to 40 mg twice daily for six weeks post-procedure. - Restart Flonase  with saline spray for nasal moisture. - Advised against lifting heavy objects post-surgery. - Instructed to ensure a driver is available for post-surgery transportation.  Tobacco use disorder Current use of Black and Mild cigars. Smoking cessation advised to reduce vocal cord risk. - Prescribed nicotine patch (7 mg) and gum (2 mg) for smoking cessation. - Consider nicotine lozenges as an additional option. - Advised to check insurance coverage for nicotine replacement therapy options.  - Orders placed:  Orders Placed This Encounter  Procedures   Ambulatory referral to Speech Therapy   - Medications prescribed/continued/adjusted:  Meds ordered this encounter  Medications   sodium chloride (OCEAN) 0.65 % SOLN nasal spray    Sig: Place 1 spray into both nostrils in the morning, at noon, and at bedtime.    Dispense:  30 mL    Refill:  0   nicotine (NICODERM CQ - DOSED IN MG/24 HR) 7 mg/24hr patch    Sig: RX #2 Weeks 7-8: 7 mg x 1 patch dailyWear for 24 hours. If you have sleep  disturbances, remove at bedtime..    Dispense:  14 patch    Refill:  0   nicotine polacrilex (NICORETTE) 2 MG gum    Sig: RX #3 Weeks 10-12: 1 piece every 4-8 hours.Max 24 pieces per day.    Dispense:  126 each    Refill:  0   nicotine polacrilex (COMMIT) 4 MG lozenge    Sig: RX #3 Weeks 10-12: 1 lozenge every 4-8 hours.Max 20 lozenges per day.    Dispense:  126 lozenge    Refill:  0   - Education materials provided to the patient. - Follow up: for surgery. Patient instructed to return sooner or go to the ED if new/worsening symptoms develop.  Thank you for allowing me the opportunity to care for your  patient. Please do not hesitate to contact me should you have any other questions.  Sincerely, Penne Croak, DO Otolaryngologist (ENT) [facility] Phone: [phone] Fax: [phone]  08/09/2024, 6:13 PM

## 2024-08-09 NOTE — H&P (View-Only) (Signed)
 Dear Dr. Tanda, Here is my assessment for our mutual patient, Kelly Meyers. Thank you for allowing me the opportunity to care for your patient. Please do not hesitate to contact me should you have any other questions. Sincerely, Dr. Penne Croak  Otolaryngology Clinic Note Referring provider: Dr. Tanda HPI:  Discussed the use of AI scribe software for clinical note transcription with the patient, who gave verbal consent to proceed.  History of Present Illness Kelly Meyers is a 48 year old female with asthma who presents with a hoarse voice.  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, which requires extensive phone communication - Experiences vocal strain and discomfort related to occupational voice use  Throat pain - Occasional throat pain, not a major concern - Throat pain appears to correlate with weather changes  Aural fullness - Sensation of fluid build-up behind the ears, particularly noticeable when blowing her nose  Asthma and upper airway management - History of asthma; specific medications not detailed - Uses Flonase  less frequently than recommended due to nasal dryness - Uses a humidifier at home to alleviate dryness  Tobacco use - Smokes Black and Mild cigars, currently about one per day - Attempting to quit and has reduced smoking from a previously higher amount   Independent Review of Additional Tests or Records:  Reviewed external note from referring PCP, Wilson,describing relevant history incorporated into today's evaluation. I personally reviewed Dr. Emerson note.   PMH/Meds/All/SocHx/FamHx/ROS:   Past Medical History:  Diagnosis Date   Allergy    Anemia    Asthma    Hypertension      Past Surgical History:  Procedure Laterality Date   CESAREAN SECTION      Family History  Problem Relation Age of Onset    Diabetes Mother    Multiple sclerosis Mother    Hypertension Father    Diabetes Father      Social Connections: Moderately Integrated (08/08/2023)   Social Connection and Isolation Panel    Frequency of Communication with Friends and Family: Twice a week    Frequency of Social Gatherings with Friends and Family: Once a week    Attends Religious Services: More than 4 times per year    Active Member of [employer] or Organizations: No    Attends Engineer, Structural: Never    Marital Status: Living with partner      Current Outpatient Medications:    albuterol  (PROVENTIL ) (2.5 MG/3ML) 0.083% nebulizer solution, USE 1 VIAL VIA NEBULIZER EVERY 6 HOURS AS NEEDED FOR WHEEZING OR SHORTNESS OF BREATH, Disp: 75 mL, Rfl: 0   albuterol  (VENTOLIN  HFA) 108 (90 Base) MCG/ACT inhaler, INHALE 1 TO 2 PUFFS INTO THE LUNGS EVERY 6 HOURS AS NEEDED FOR WHEEZING OR SHORTNESS OF BREATH, Disp: 6.7 g, Rfl: 1   amLODipine  (NORVASC ) 10 MG tablet, Take 1 tablet (10 mg total) by mouth daily., Disp: 90 tablet, Rfl: 1   famotidine (PEPCID) 20 MG tablet, Take 1 tablet (20 mg total) by mouth 2 (two) times daily. (Patient not taking: Reported on 08/08/2024), Disp: 30 tablet, Rfl: 1   fluticasone  (FLONASE ) 50 MCG/ACT nasal spray, Place 2 sprays into both nostrils daily., Disp: 16 g, Rfl: 2   fluticasone  (FLONASE ) 50 MCG/ACT nasal spray, Place 2 sprays into both nostrils daily., Disp: 16 g, Rfl: 6   fluticasone  (FLOVENT  HFA) 220 MCG/ACT inhaler, Inhale 2 puffs into  the lungs daily. (Patient not taking: Reported on 08/08/2024), Disp: 1 each, Rfl: 3   Fluticasone  Furoate (ARNUITY ELLIPTA ) 100 MCG/ACT AEPB, Inhale 2 puffs into the lungs daily., Disp: 90 each, Rfl: 1   fluticasone -salmeterol (ADVAIR DISKUS) 500-50 MCG/ACT AEPB, Inhale 1 puff into the lungs in the morning and at bedtime., Disp: 60 each, Rfl: 1   hydrochlorothiazide  (HYDRODIURIL ) 25 MG tablet, Take 1 tablet (25 mg total) by mouth daily., Disp: 90 tablet, Rfl:  1   levocetirizine (XYZAL ALLERGY 24HR) 5 MG tablet, Take 1 tablet (5 mg total) by mouth every evening., Disp: 30 tablet, Rfl: 3   meclizine  (ANTIVERT ) 25 MG tablet, Take 1 tablet (25 mg total) by mouth 3 (three) times daily as needed for dizziness. (Patient not taking: Reported on 08/08/2024), Disp: 30 tablet, Rfl: 1   nicotine (NICODERM CQ - DOSED IN MG/24 HR) 7 mg/24hr patch, RX #2 Weeks 7-8: 7 mg x 1 patch dailyWear for 24 hours. If you have sleep disturbances, remove at bedtime.. (Patient not taking: Reported on 08/08/2024), Disp: 14 patch, Rfl: 0   nicotine polacrilex (COMMIT) 4 MG lozenge, RX #3 Weeks 10-12: 1 lozenge every 4-8 hours.Max 20 lozenges per day. (Patient not taking: Reported on 08/08/2024), Disp: 126 lozenge, Rfl: 0   nicotine polacrilex (NICORETTE) 2 MG gum, RX #3 Weeks 10-12: 1 piece every 4-8 hours.Max 24 pieces per day. (Patient not taking: Reported on 08/08/2024), Disp: 126 each, Rfl: 0   omeprazole  (PRILOSEC) 20 MG capsule, Take 1 capsule (20 mg total) by mouth daily., Disp: 90 capsule, Rfl: 0   sodium chloride (OCEAN) 0.65 % SOLN nasal spray, Place 1 spray into both nostrils in the morning, at noon, and at bedtime., Disp: 30 mL, Rfl: 0   valsartan  (DIOVAN ) 320 MG tablet, Take 1 tablet (320 mg total) by mouth daily., Disp: 90 tablet, Rfl: 1   Colloidal Oatmeal (ECZEMA MOISTURIZING EX), Apply 1 Application topically daily. Eczema Honey, Disp: , Rfl:    Physical Exam:   BP (!) 167/100 (BP Location: Left Arm, Patient Position: Sitting, Cuff Size: Normal) Comment: Pt dis not take am PB meds  Pulse 74   Temp (!) 97.4 F (36.3 C) (Oral)   Ht 5' 1 (1.549 m)   Wt 198 lb (89.8 kg)   SpO2 97%   BMI 37.41 kg/m   The patient was awake, alert, and appropriate. The external ears were inspected, and otoscopy was performed to evaluate the external auditory canals and tympanic membranes. The nasal cavity and septum were examined for mucosal changes, obstruction, or discharge. The oral  cavity and oropharynx were inspected for mucosal lesions, infection, or tonsillar hypertrophy. The neck was palpated for lymphadenopathy, thyroid  abnormalities, or other masses. Cranial nerve function was grossly intact.  Pertinent Findings: Physical Exam HEENT: No fluid in ears bilaterally. Throat normal. Nasal passages tight bilaterally. Nasal breathing intact.   Seprately Identifiable Procedures:  I personally ordered, reviewed and interpreted the following with the patient today  Procedure Note Pre-procedure diagnosis:  Dysphonia  Post-procedure diagnosis: Same, nasal polyp Procedure: Transnasal Fiberoptic Laryngoscopy, CPT 31575 - Mod 25 Indication: 37F with dysphonia and vocal fold polyp. Performed for surgical planning Complications: None apparent EBL: 0 mL  The procedure was undertaken to further evaluate the patient's complaint of dysphonia, with mirror exam inadequate for appropriate examination due to gag reflex and poor patient tolerance  Procedure:  Patient was identified as correct patient. Verbal consent was obtained. The nose was sprayed with oxymetazoline and 4% lidocaine.  The flexible laryngoscope was passed through the nose to view the nasal cavity, pharynx (oropharynx, hypopharynx) and larynx.  The larynx was examined at rest and during multiple phonatory tasks. Documentation was obtained and reviewed with patient. The scope was removed. The patient tolerated the procedure well.  Findings: The nasal cavity and nasopharynx did not reveal any masses or lesions, mucosa appeared to be without obvious lesions. The tongue base, pharyngeal walls, piriform sinuses, vallecula, epiglottis and postcricoid region are normal in appearance EXCEPT: right vocal fold polyp, mildly ball valving. The visualized portion of the subglottis and proximal trachea is widely patent. The vocal folds are mobile bilaterally. There are no lesions on the free edge of the vocal folds nor elsewhere in the  larynx worrisome for malignancy.    Electronically signed by: Penne Croak, DO 08/09/2024 6:17 PM        Impression & Plans:  Revella Shelton is a 47 y.o. female  1. Hoarseness of voice   2. Reinke's edema of vocal folds   3. Vocal cord polyp   4. Dysphonia   5. Tobacco abuse     - Findings and diagnoses discussed in detail with the patient. - Risks, benefits, and alternatives were reviewed. Through shared decision making, the patient elects to proceed with below. Assessment & Plan Vocal cord polyp with dysphonia Chronic hoarseness with vocal cord polyp likely due to smoking. Cancer unlikely. Surgery planned for polyp removal. - Scheduled outpatient surgery for vocal cord polyp removal. - Advised voice rest post-surgery. - Recommended speech therapy to strengthen voice and protect it post-surgery. - Increased Prilosec to 40 mg twice daily for six weeks post-procedure. - Restart Flonase  with saline spray for nasal moisture. - Advised against lifting heavy objects post-surgery. - Instructed to ensure a driver is available for post-surgery transportation.  Tobacco use disorder Current use of Black and Mild cigars. Smoking cessation advised to reduce vocal cord risk. - Prescribed nicotine patch (7 mg) and gum (2 mg) for smoking cessation. - Consider nicotine lozenges as an additional option. - Advised to check insurance coverage for nicotine replacement therapy options.  - Orders placed:  Orders Placed This Encounter  Procedures   Ambulatory referral to Speech Therapy   - Medications prescribed/continued/adjusted:  Meds ordered this encounter  Medications   sodium chloride (OCEAN) 0.65 % SOLN nasal spray    Sig: Place 1 spray into both nostrils in the morning, at noon, and at bedtime.    Dispense:  30 mL    Refill:  0   nicotine (NICODERM CQ - DOSED IN MG/24 HR) 7 mg/24hr patch    Sig: RX #2 Weeks 7-8: 7 mg x 1 patch dailyWear for 24 hours. If you have sleep  disturbances, remove at bedtime..    Dispense:  14 patch    Refill:  0   nicotine polacrilex (NICORETTE) 2 MG gum    Sig: RX #3 Weeks 10-12: 1 piece every 4-8 hours.Max 24 pieces per day.    Dispense:  126 each    Refill:  0   nicotine polacrilex (COMMIT) 4 MG lozenge    Sig: RX #3 Weeks 10-12: 1 lozenge every 4-8 hours.Max 20 lozenges per day.    Dispense:  126 lozenge    Refill:  0   - Education materials provided to the patient. - Follow up: for surgery. Patient instructed to return sooner or go to the ED if new/worsening symptoms develop.  Thank you for allowing me the opportunity to care for your  patient. Please do not hesitate to contact me should you have any other questions.  Sincerely, Penne Croak, DO Otolaryngologist (ENT) [facility] Phone: [phone] Fax: [phone]  08/09/2024, 6:13 PM

## 2024-08-12 ENCOUNTER — Encounter (HOSPITAL_COMMUNITY): Payer: Self-pay

## 2024-08-12 ENCOUNTER — Telehealth (INDEPENDENT_AMBULATORY_CARE_PROVIDER_SITE_OTHER): Payer: Self-pay

## 2024-08-12 NOTE — Telephone Encounter (Signed)
 The patient called with questions about surgery.  Can someone please her back? Thanks

## 2024-08-13 ENCOUNTER — Other Ambulatory Visit: Payer: Self-pay

## 2024-08-13 ENCOUNTER — Encounter (HOSPITAL_COMMUNITY): Payer: Self-pay

## 2024-08-13 ENCOUNTER — Telehealth (INDEPENDENT_AMBULATORY_CARE_PROVIDER_SITE_OTHER): Payer: Self-pay

## 2024-08-13 NOTE — Anesthesia Preprocedure Evaluation (Addendum)
 Anesthesia Evaluation  Patient identified by MRN, date of birth, ID band Patient awake    Reviewed: Allergy & Precautions, NPO status , Patient's Chart, lab work & pertinent test results  History of Anesthesia Complications (+) history of anesthetic complications (itching and rash after epidural)  Airway Mallampati: III  TM Distance: >3 FB Neck ROM: Full    Dental  (+) Dental Advisory Given, Chipped,    Pulmonary neg shortness of breath, asthma , neg sleep apnea, neg COPD, neg recent URI, Current Smoker and Patient abstained from smoking.   Pulmonary exam normal breath sounds clear to auscultation       Cardiovascular hypertension (amlodipine , HCTZ, valsartan ), Pt. on medications (-) angina (-) Past MI, (-) Cardiac Stents and (-) CABG (-) dysrhythmias  Rhythm:Regular Rate:Normal     Neuro/Psych negative neurological ROS     GI/Hepatic Neg liver ROS,GERD  Medicated,,  Endo/Other  negative endocrine ROS    Renal/GU negative Renal ROS     Musculoskeletal   Abdominal  (+) + obese  Peds  Hematology  (+) Blood dyscrasia, anemia   Anesthesia Other Findings   Reproductive/Obstetrics                              Anesthesia Physical Anesthesia Plan  ASA: 2  Anesthesia Plan: General   Post-op Pain Management: Tylenol PO (pre-op)*   Induction: Intravenous  PONV Risk Score and Plan: 2 and Ondansetron, Dexamethasone, Midazolam and Treatment may vary due to age or medical condition  Airway Management Planned: Oral ETT  Additional Equipment:   Intra-op Plan:   Post-operative Plan: Extubation in OR  Informed Consent: I have reviewed the patients History and Physical, chart, labs and discussed the procedure including the risks, benefits and alternatives for the proposed anesthesia with the patient or authorized representative who has indicated his/her understanding and acceptance.      Dental advisory given  Plan Discussed with: CRNA and Anesthesiologist  Anesthesia Plan Comments: (Risks of general anesthesia discussed including, but not limited to, sore throat, hoarse voice, chipped/damaged teeth, injury to vocal cords, nausea and vomiting, allergic reactions, lung infection, heart attack, stroke, and death. All questions answered.   Chart reviewed by Allison Zelenak, PA-C. Patient is a same day work-up. Updated labs and EKG on the day of surgery as indicated.   08/08/2024: Transnasal Fiberoptic Laryngoscopy: Indication: 88F with dysphonia and vocal fold polyp. Performed for surgical planning Findings: The nasal cavity and nasopharynx did not reveal any masses or lesions, mucosa appeared to be without obvious lesions. The tongue base, pharyngeal walls, piriform sinuses, vallecula, epiglottis and postcricoid region are normal in appearance EXCEPT: right vocal fold polyp, mildly ball valving. The visualized portion of the subglottis and proximal trachea is widely patent. The vocal folds are mobile bilaterally. There are no lesions on the free edge of the vocal folds nor elsewhere in the larynx worrisome for malignancy.     )         Anesthesia Quick Evaluation

## 2024-08-13 NOTE — Progress Notes (Signed)
 SDW call  Patient was given pre-op instructions over the phone. Patient verbalized understanding of instructions provided.     PCP - Dr. Raguel Blush Cardiologist -  Pulmonary:    PPM/ICD - denies Device Orders - na Rep Notified - na   Chest x-ray - na EKG -  DOS, 08/14/2024 Stress Test - ECHO -  Cardiac Cath -   Sleep Study/sleep apnea/CPAP: denies  Non-diabetic  Blood Thinner Instructions: denies Aspirin Instructions:denies   ERAS Protcol - NPO   Anesthesia review: Yes. Asthma, HTN   Patient denies shortness of breath, fever, cough and chest pain over the phone call  Your procedure is scheduled on Wednesday August 14, 2024  Report to [facility] Main Entrance A at  1045  A.M., then check in with the Admitting office.  Call this number if you have problems the morning of surgery:  [phone]   If you have any questions prior to your surgery date call [phone]: Open Monday-Friday 8am-4pm If you experience any cold or flu symptoms such as cough, fever, chills, shortness of breath, etc. between now and your scheduled surgery, please notify us  at the above number    Remember:  Do not eat or drink after midnight the night before your surgery  Take these medicines the morning of surgery with A SIP OF WATER:  Amlodipine , flonase , ellipta inhaler, advair, omeprazole , ocean nasal  As needed: Albuterol  neb/inhaler  As of today, STOP taking any Aspirin (unless otherwise instructed by your surgeon) Aleve , Naproxen , Ibuprofen , Motrin , Advil , Goody's, BC's, all herbal medications, fish oil, and all vitamins.

## 2024-08-13 NOTE — Telephone Encounter (Signed)
 Received completed FMLA form from Dr. Anice.  Faxed it to [employer] and [employer] Team at [phone] and received fax confirmation. Called the patient and LVM letting her know that the completed form had been faxed.  A copy of the completed form has been moved into the patient's record.

## 2024-08-14 ENCOUNTER — Telehealth (INDEPENDENT_AMBULATORY_CARE_PROVIDER_SITE_OTHER): Payer: Self-pay

## 2024-08-14 ENCOUNTER — Encounter (HOSPITAL_COMMUNITY): Payer: Self-pay

## 2024-08-14 ENCOUNTER — Ambulatory Visit (HOSPITAL_COMMUNITY): Payer: Self-pay | Admitting: Vascular Surgery

## 2024-08-14 ENCOUNTER — Ambulatory Visit (HOSPITAL_COMMUNITY): Admission: RE | Admit: 2024-08-14 | Discharge: 2024-08-14 | Disposition: A | Discharge status: Home / Self Care

## 2024-08-14 ENCOUNTER — Encounter (HOSPITAL_COMMUNITY): Admission: RE | Disposition: A | Payer: Self-pay | Source: Home / Self Care

## 2024-08-14 DIAGNOSIS — J383 Other diseases of vocal cords: Secondary | ICD-10-CM

## 2024-08-14 DIAGNOSIS — D649 Anemia, unspecified: Secondary | ICD-10-CM | POA: Diagnosis not present

## 2024-08-14 DIAGNOSIS — Z79899 Other long term (current) drug therapy: Secondary | ICD-10-CM | POA: Diagnosis not present

## 2024-08-14 DIAGNOSIS — I10 Essential (primary) hypertension: Secondary | ICD-10-CM | POA: Insufficient documentation

## 2024-08-14 DIAGNOSIS — J381 Polyp of vocal cord and larynx: Secondary | ICD-10-CM

## 2024-08-14 DIAGNOSIS — E669 Obesity, unspecified: Secondary | ICD-10-CM | POA: Insufficient documentation

## 2024-08-14 DIAGNOSIS — J45909 Unspecified asthma, uncomplicated: Secondary | ICD-10-CM | POA: Diagnosis not present

## 2024-08-14 DIAGNOSIS — R490 Dysphonia: Secondary | ICD-10-CM | POA: Diagnosis not present

## 2024-08-14 DIAGNOSIS — Z7951 Long term (current) use of inhaled steroids: Secondary | ICD-10-CM | POA: Insufficient documentation

## 2024-08-14 DIAGNOSIS — K219 Gastro-esophageal reflux disease without esophagitis: Secondary | ICD-10-CM | POA: Insufficient documentation

## 2024-08-14 DIAGNOSIS — Z6837 Body mass index (BMI) 37.0-37.9, adult: Secondary | ICD-10-CM | POA: Insufficient documentation

## 2024-08-14 DIAGNOSIS — F17290 Nicotine dependence, other tobacco product, uncomplicated: Secondary | ICD-10-CM | POA: Insufficient documentation

## 2024-08-14 HISTORY — PX: MICROLARYNGOSCOPY WITH LASER: SHX5972

## 2024-08-14 HISTORY — DX: Other complications of anesthesia, initial encounter: T88.59XA

## 2024-08-14 LAB — POCT PREGNANCY, URINE: Preg Test, Ur: NEGATIVE

## 2024-08-14 LAB — BASIC METABOLIC PANEL WITH GFR
Anion gap: 14 (ref 5–15)
BUN: 8 mg/dL (ref 6–20)
CO2: 20 mmol/L — ABNORMAL LOW (ref 22–32)
Calcium: 9.1 mg/dL (ref 8.9–10.3)
Chloride: 103 mmol/L (ref 98–111)
Creatinine, Ser: 0.78 mg/dL (ref 0.44–1.00)
GFR, Estimated: >60 mL/min (ref >60)
Glucose, Bld: 109 mg/dL — ABNORMAL HIGH (ref 70–99)
Potassium: 3.4 mmol/L — ABNORMAL LOW (ref 3.5–5.1)
Sodium: 137 mmol/L (ref 135–145)

## 2024-08-14 LAB — CBC
HCT: 43.1 % (ref 36.0–46.0)
Hemoglobin: 14.6 g/dL (ref 12.0–15.0)
MCH: 30.7 pg (ref 26.0–34.0)
MCHC: 33.9 g/dL (ref 30.0–36.0)
MCV: 90.7 fL (ref 80.0–100.0)
Platelets: 304 K/uL (ref 150–400)
RBC: 4.75 MIL/uL (ref 3.87–5.11)
RDW: 13.6 % (ref 11.5–15.5)
WBC: 11.6 K/uL — ABNORMAL HIGH (ref 4.0–10.5)
nRBC: 0 % (ref 0.0–0.2)

## 2024-08-14 SURGERY — MICROLARYNGOSCOPY, WITH PROCEDURE USING LASER
Anesthesia: General | Laterality: Right

## 2024-08-14 MED ORDER — FENTANYL CITRATE (PF) 100 MCG/2ML IJ SOLN: 25.0000 ug | INTRAMUSCULAR | Status: DC | PRN

## 2024-08-14 MED ORDER — LIDOCAINE 2% (20 MG/ML) 5 ML SYRINGE
INTRAMUSCULAR | Status: AC
Start: 2024-08-14 — End: 2024-08-14
  Filled 2024-08-14: qty 5

## 2024-08-14 MED ORDER — MIDAZOLAM HCL (PF) 2 MG/2ML IJ SOLN
INTRAMUSCULAR | Status: DC | PRN
  Administered 2024-08-14: 2 mg via INTRAVENOUS

## 2024-08-14 MED ORDER — AMISULPRIDE (ANTIEMETIC) 5 MG/2ML IV SOLN: 10.0000 mg | Freq: Once | INTRAVENOUS | Status: DC | PRN

## 2024-08-14 MED ORDER — SODIUM CHLORIDE (PF) 0.9 % IJ SOLN
INTRAMUSCULAR | Status: DC | PRN
Start: 2024-08-14 — End: 2024-08-14
  Administered 2024-08-14: 100 mL

## 2024-08-14 MED ORDER — CHLORHEXIDINE GLUCONATE 0.12 % MT SOLN
15.0000 mL | Freq: Once | OROMUCOSAL | Status: AC
  Administered 2024-08-14: 15 mL via OROMUCOSAL
  Filled 2024-08-14: qty 15

## 2024-08-14 MED ORDER — MIDAZOLAM HCL 2 MG/2ML IJ SOLN
INTRAMUSCULAR | Status: AC
  Filled 2024-08-14: qty 2

## 2024-08-14 MED ORDER — EPINEPHRINE PF 1 MG/ML IJ SOLN
INTRAMUSCULAR | Status: AC
  Filled 2024-08-14: qty 1

## 2024-08-14 MED ORDER — ROCURONIUM BROMIDE 10 MG/ML (PF) SYRINGE
PREFILLED_SYRINGE | INTRAVENOUS | Status: AC
Start: 2024-08-14 — End: 2024-08-14
  Filled 2024-08-14: qty 10

## 2024-08-14 MED ORDER — ACETAMINOPHEN-CODEINE 300-15 MG PO TABS: 1.0000 | ORAL_TABLET | Freq: Four times a day (QID) | ORAL | 0 refills | Status: AC | PRN

## 2024-08-14 MED ORDER — PROPOFOL 10 MG/ML IV BOLUS
INTRAVENOUS | Status: DC | PRN
  Administered 2024-08-14: 50 mg via INTRAVENOUS
  Administered 2024-08-14: 150 mg via INTRAVENOUS

## 2024-08-14 MED ORDER — EPINEPHRINE PF 1 MG/ML IJ SOLN
INTRAMUSCULAR | Status: DC | PRN
  Administered 2024-08-14: 1 mL

## 2024-08-14 MED ORDER — 0.9 % SODIUM CHLORIDE (POUR BTL) OPTIME
TOPICAL | Status: DC | PRN
  Administered 2024-08-14: 1000 mL

## 2024-08-14 MED ORDER — ONDANSETRON HCL 4 MG/2ML IJ SOLN
INTRAMUSCULAR | Status: AC
  Filled 2024-08-14: qty 2

## 2024-08-14 MED ORDER — TRIAMCINOLONE ACETONIDE 40 MG/ML IJ SUSP
INTRAMUSCULAR | Status: AC
Start: 2024-08-14 — End: 2024-08-14
  Filled 2024-08-14: qty 5

## 2024-08-14 MED ORDER — DEXAMETHASONE SOD PHOSPHATE PF 10 MG/ML IJ SOLN
INTRAMUSCULAR | Status: DC | PRN
  Administered 2024-08-14: 10 mg via INTRAVENOUS

## 2024-08-14 MED ORDER — PROPOFOL 10 MG/ML IV BOLUS
INTRAVENOUS | Status: AC
Start: 2024-08-14 — End: 2024-08-14
  Filled 2024-08-14: qty 20

## 2024-08-14 MED ORDER — OXYMETAZOLINE HCL 0.05 % NA SOLN
NASAL | Status: AC
Start: 2024-08-14 — End: 2024-08-14
  Filled 2024-08-14: qty 30

## 2024-08-14 MED ORDER — PHENYLEPHRINE 80 MCG/ML (10ML) SYRINGE FOR IV PUSH (FOR BLOOD PRESSURE SUPPORT)
PREFILLED_SYRINGE | INTRAVENOUS | Status: DC | PRN
  Administered 2024-08-14 (×3): 80 ug via INTRAVENOUS

## 2024-08-14 MED ORDER — ONDANSETRON HCL 4 MG/2ML IJ SOLN
INTRAMUSCULAR | Status: DC | PRN
  Administered 2024-08-14: 4 mg via INTRAVENOUS

## 2024-08-14 MED ORDER — ACETAMINOPHEN 500 MG PO TABS
1000.0000 mg | ORAL_TABLET | Freq: Once | ORAL | Status: AC
  Administered 2024-08-14: 1000 mg via ORAL
  Filled 2024-08-14: qty 2

## 2024-08-14 MED ORDER — PHENYLEPHRINE 80 MCG/ML (10ML) SYRINGE FOR IV PUSH (FOR BLOOD PRESSURE SUPPORT)
PREFILLED_SYRINGE | INTRAVENOUS | Status: AC
Start: 2024-08-14 — End: 2024-08-14
  Filled 2024-08-14: qty 10

## 2024-08-14 MED ORDER — FENTANYL CITRATE (PF) 250 MCG/5ML IJ SOLN
INTRAMUSCULAR | Status: DC | PRN
Start: 2024-08-14 — End: 2024-08-14
  Administered 2024-08-14: 100 ug via INTRAVENOUS
  Administered 2024-08-14 (×3): 50 ug via INTRAVENOUS

## 2024-08-14 MED ORDER — ROCURONIUM BROMIDE 100 MG/10ML IV SOLN
INTRAVENOUS | Status: DC | PRN
  Administered 2024-08-14: 60 mg via INTRAVENOUS

## 2024-08-14 MED ORDER — ORAL CARE MOUTH RINSE: 15.0000 mL | Freq: Once | OROMUCOSAL | Status: AC

## 2024-08-14 MED ORDER — LACTATED RINGERS IV SOLN: INTRAVENOUS | Status: DC

## 2024-08-14 MED ORDER — SUGAMMADEX SODIUM 200 MG/2ML IV SOLN
INTRAVENOUS | Status: DC | PRN
  Administered 2024-08-14: 200 mg via INTRAVENOUS

## 2024-08-14 MED ORDER — LIDOCAINE HCL (PF) 2 % IJ SOLN
INTRAMUSCULAR | Status: DC | PRN
  Administered 2024-08-14: 10 mL via INTRADERMAL

## 2024-08-14 MED ORDER — FENTANYL CITRATE (PF) 250 MCG/5ML IJ SOLN
INTRAMUSCULAR | Status: AC
  Filled 2024-08-14: qty 5

## 2024-08-14 MED ORDER — AMOXICILLIN-POT CLAVULANATE 400-57 MG/5ML PO SUSR: 800.0000 mg | Freq: Two times a day (BID) | ORAL | 0 refills | Status: AC

## 2024-08-14 SURGICAL SUPPLY — 31 items
BAG COUNTER SPONGE SURGICOUNT (BAG) ×2 IMPLANT
BALLN PULMONARY 10-12 (MISCELLANEOUS) IMPLANT
BALLOON PULM 12 13.5 15X75 (BALLOONS) IMPLANT
BLADE SURG 15 STRL LF DISP TIS (BLADE) IMPLANT
BNDG EYE OVAL 2 1/8 X 2 5/8 (GAUZE/BANDAGES/DRESSINGS) ×2 IMPLANT
CANISTER SUCTION 3000ML PPV (SUCTIONS) ×1 IMPLANT
CNTNR URN SCR LID CUP LEK RST (MISCELLANEOUS) IMPLANT
COVER BACK TABLE 60X90IN (DRAPES) ×2 IMPLANT
COVER MAYO STAND STRL (DRAPES) ×1 IMPLANT
DRAPE HALF SHEET 40X57 (DRAPES) ×1 IMPLANT
GAUZE SPONGE 4X4 12PLY STRL (GAUZE/BANDAGES/DRESSINGS) ×2 IMPLANT
GLOVE BIO SURGEON STRL SZ8 (GLOVE) ×2 IMPLANT
GLOVE BIOGEL PI IND STRL 8 (GLOVE) ×1 IMPLANT
GOWN STRL REUS W/ TWL LRG LVL3 (GOWN DISPOSABLE) IMPLANT
GOWN STRL REUS W/TWL 2XL LVL3 (GOWN DISPOSABLE) ×1 IMPLANT
KIT BASIN OR (CUSTOM PROCEDURE TRAY) ×1 IMPLANT
KIT TURNOVER KIT B (KITS) ×2 IMPLANT
NDL HYPO 25GX1X1/2 BEV (NEEDLE) IMPLANT
NEEDLE HYPO 25GX1X1/2 BEV (NEEDLE) IMPLANT
PAD ARMBOARD POSITIONER FOAM (MISCELLANEOUS) ×4 IMPLANT
PATTIES SURGICAL .5X1.5 (GAUZE/BANDAGES/DRESSINGS) ×1 IMPLANT
POSITIONER HEAD DONUT 9IN (MISCELLANEOUS) IMPLANT
SET COLLECT BLD 25X3/4 12 (NEEDLE) ×1 IMPLANT
SOLN 0.9% NACL POUR BTL 1000ML (IV SOLUTION) ×1 IMPLANT
SOLN STERILE WATER BTL 1000 ML (IV SOLUTION) ×2 IMPLANT
SOLUTION ANTFG W/FOAM PAD STRL (MISCELLANEOUS) ×2 IMPLANT
SURGILUBE 2OZ TUBE FLIPTOP (MISCELLANEOUS) IMPLANT
SUT SILK 2 0 PERMA HAND 18 BK (SUTURE) IMPLANT
SYR 3ML LL SCALE MARK (SYRINGE) ×1 IMPLANT
TOWEL GREEN STERILE FF (TOWEL DISPOSABLE) ×1 IMPLANT
TUBE CONNECTING 12X1/4 (SUCTIONS) ×1 IMPLANT

## 2024-08-14 NOTE — Transfer of Care (Signed)
 Immediate Anesthesia Transfer of Care Note  Patient: Kelly Meyers  Procedure(s) Performed: MICROLARYNGOSCOPY, WITH PROCEDURE USING CO2 LASER, STEROID INJECTION (Right)  Patient Location: PACU  Anesthesia Type:General  Level of Consciousness: awake, alert , and oriented  Airway & Oxygen Therapy: Patient Spontaneously Breathing  Post-op Assessment: Report given to RN and Post -op Vital signs reviewed and stable  Post vital signs: Reviewed and stable  Last Vitals:  Vitals Value Taken Time  BP 131/63 08/14/24 14:45  Temp 36.8 C 08/14/24 14:42  Pulse 84 08/14/24 14:46  Resp 20 08/14/24 14:47  SpO2 95 % 08/14/24 14:46  Vitals shown include unfiled device data.  Last Pain:  Vitals:   08/14/24 1442  TempSrc:   PainSc: 0-No pain      Patients Stated Pain Goal: 4 (08/14/24 1142)  Complications: No notable events documented.

## 2024-08-14 NOTE — Op Note (Signed)
 Otolaryngology Operative note  Kelly Meyers Date/Time of Admission: 08/14/2024 11:01 AM  CSN: [id number];MRN:[id number]  DOB: [birth year] Age: 48 y.o. Location: [facility] OR    Pre-Op Diagnosis: Polyp, vocal cord  Post-Op Diagnosis: Polyp, vocal cord  Procedure: Procedure(s): MICROLARYNGOSCOPY, WITH PROCEDURE USING CO2 LASER, STEROID INJECTION 31545 (bilateral) 68428 Laryngoscopy, direct, with injection into vocal cord(s), therapeutic (e.g., for vocal cord paralysis, scar, granuloma)  Surgeon: Penne Croak, DO  Anesthesia type:  General  Anesthesiologist: Anesthesiologist: Peggye Delon Brunswick, MD CRNA: Roslynn Waddell LABOR, CRNA; Genny Gun, CRNA   Staff: Circulator: Veda Verla SAILOR, RN Scrub Person: Evaline, Tiffany Circulator Assistant: Lenon Keven CROME, RN  Implants: * No implants in log *  Specimens: ID Type Source Tests Collected by Time Destination  1 : right vocal cord lesion Tissue PATH ENT excision SURGICAL PATHOLOGY Croak Penne, DO 08/14/2024 1407     EBL:  2 mL  Drains: none  Post-op disposition and condition: PACU, hemodynamically stable   Findings:  Complications: None apparent  Indications and consent:  Kelly Meyers is a 48 y.o. female with diagnoses above. The patient's options were discussed, including risks/benefits/alternatives for each option. Patient expressed understanding, and despite these risks, consented and decided to proceed with above procedures. Informed consent was signed before proceeding.  Procedure: The patient was brought to the operating room and placed supine on the operating table. After induction of general anesthesia and endotracheal intubation with a laser-safe endotracheal tube, the head of the bed was elevated and the patient was appropriately draped for microlaryngoscopy. A tooth guard was placed for dental protection.  A suspension laryngoscope was introduced and positioned to provide an  adequate view of the glottis under microscopic visualization. The operating microscope was brought into the field and focused on the vocal folds.  Attention was directed to the right vocal fold(s), where a polypoid lesion was identified. Photos taken. The epithelium was incised with microinstrumentation and a microflap elevated. Some gelatinous material removed. The polyp was medialized and microscissor used to carefully excise the lesion. Using the CO? laser in 4 watts, hemostasis was achieved on the right and remaining polypoid tissue ablate, taking care to preserve the underlying vocal ligament and minimize thermal spread. The specimen was retrieved and sent for permanent histopathologic evaluation.  The contralateral (left) vocal fold demonstrated mild reactive polypoid change along the mid-membranous portion, likely secondary to impact trauma from the primary lesion. This area was lightly ablated using the CO? laser at low power to achieve symmetric contour and smooth the mucosal surface. No distinct lesion or mass was identified, and no tissue specimen was collected from this side.  Following excision, bilateral vocal fold steroid injections were performed under microscopic visualization. Using a 25-gauge butterfly injection needle, 0.2 mL of triamcinolone acetonide (Kenalog 40 mg/mL) was injected into the superficial lamina propria of each vocal fold at the mid-membranous portion, ensuring even distribution and absence of subepithelial blanching or leakage.  The laryngoscope was carefully removed. The teeth and lips were inspected and noted to be intact. The patient was awakened from anesthesia and transferred to the recovery room in stable condition.

## 2024-08-14 NOTE — Anesthesia Procedure Notes (Signed)
 Procedure Name: Intubation Date/Time: 08/14/2024 1:46 PM  Performed by: Roslynn Waddell LABOR, CRNAPre-anesthesia Checklist: Patient identified, Emergency Drugs available, Suction available and Patient being monitored Patient Re-evaluated:Patient Re-evaluated prior to induction Oxygen Delivery Method: Circle System Utilized Preoxygenation: Pre-oxygenation with 100% oxygen Induction Type: IV induction Ventilation: Oral airway inserted - appropriate to patient size Laryngoscope Size: Mac and 3 Grade View: Grade I Tube type: Oral Laser Tube: Laser Tube and Cuffed inflated with minimal occlusive pressure - saline Tube size: 7.0 mm Number of attempts: 1 Airway Equipment and Method: Stylet and Oral airway Placement Confirmation: ETT inserted through vocal cords under direct vision, positive ETCO2 and breath sounds checked- equal and bilateral Secured at: 22 cm Tube secured with: Tape Dental Injury: Teeth and Oropharynx as per pre-operative assessment  Comments: Atraumatic induction/intubation. Dentition and oral mucosa as per preop.

## 2024-08-14 NOTE — Anesthesia Postprocedure Evaluation (Signed)
 Anesthesia Post Note  Patient: Kelly Meyers  Procedure(s) Performed: MICROLARYNGOSCOPY, WITH PROCEDURE USING CO2 LASER, STEROID INJECTION (Right)     Patient location during evaluation: PACU Anesthesia Type: General Level of consciousness: awake Pain management: pain level controlled Vital Signs Assessment: post-procedure vital signs reviewed and stable Respiratory status: spontaneous breathing, nonlabored ventilation and respiratory function stable Cardiovascular status: blood pressure returned to baseline and stable Postop Assessment: no apparent nausea or vomiting Anesthetic complications: no   No notable events documented.  Last Vitals:  Vitals:   08/14/24 1500 08/14/24 1515  BP: 121/70 (!) 153/86  Pulse: 86 74  Resp: 16 15  Temp:    SpO2: 94% 94%    Last Pain:  Vitals:   08/14/24 1515  TempSrc:   PainSc: 4                  Delon Aisha Arch

## 2024-08-14 NOTE — Telephone Encounter (Signed)
 Returned patient's call regarding questions about surgery. LVM for patient to call back. Patient returned call, patient said that everything was good.

## 2024-08-14 NOTE — Interval H&P Note (Signed)
 History and Physical Interval Note:  08/14/2024 1:07 PM  Kelly Meyers  has presented today for surgery, with the diagnosis of right Polyp, vocal cord.  The various methods of treatment have been discussed with the patient and family. After consideration of risks, benefits and other options for treatment, the patient has consented to  Procedure(s) with comments: MICROLARYNGOSCOPY, WITH PROCEDURE USING LASER (Right) - DML bronchoscopy and CO2 laser excision of the right vocal fold lesion as a surgical intervention.  The patient's history has been reviewed, patient examined, no change in status, stable for surgery.  I have reviewed the patient's chart and labs.  Questions were answered to the patient's satisfaction.     Penne Croak

## 2024-08-14 NOTE — Discharge Instructions (Addendum)
 POST-OP Instructions  Vocal Cord Surgery This post-operative instruction sheet is designed to help you care for your voice/throat after surgery and help answer any of the common questions you may have. It is not entirely comprehensive, so if you have any questions, do not hesitate to call the office.   What to Expect: - It is common to have a sore throat for several days after surgery. You may have some tongue numbness/pain or taste changes as well - these are temporary but can take several weeks to resolve. - You can eat and drink anything you normally do - there are no restrictions due to surgery alone.  If you have been recommended any other type of diet, such as an acid reflux diet, you should continue that.  You may want to eat light meals the day of anesthesia to make sure you don't get nauseated. You may have some pain after surgery, even with pain medications. To make this pain more tolerable, you should use Tylenol and/or ibuprofen . You can take 500mg -600mg  of tylenol every 6 hours, alternating with to 400-600mg  of ibuprofen  every 6 hours, so long as your regular doctor hasn't told you to avoid either of these medications. If you don't take acid reflux medication already, you should add a dose of famotidine 20mg  with any ibuprofen  dose over 400mg . If you're not sure whether these medications are safe for you, you should ask Dr Anice or your regular doctor first (particularly if you have a heart condition or history of stomach ulcers or are on blood thinners). If it is more severe pain that doesn't respond to the medications above, you should request or use the prescription narcotic pain medicine you were given. (You can call the office to request a script if you need one.)  If you have to use narcotics, plan to use a stool softener as well.  - It is very important that you stay well-hydrated and drink plenty of fluids during the recovery period.  Getting dehydrated tends to worsen post-operative  pain. - Avoid tobacco products, spicy foods, excessive alcohol, or eating late at night as these may cause heartburn or reflux of stomach acid into the throat and may delay the healing process.  If you are on reflux medication already (such as Prilosec (omeprazole ) or Nexium (esomeprazole)), continue it after surgery.  If you are not on anything regularly and have reflux symptoms in the post-operative period (heartburn, throat burning, excessive throat mucous or throat-clearing, sensation of a lump in your throat) then you can treat these symptoms with over-the-counter Pepcid 20-40mg . - Avoid coughing or throat clearing. If you have the urge to clear your throat, take a sip of water and a hard swallow instead. Drinking plenty of water can help alleviate the urge to clear the throat.  If you cannot control your cough, you may take the narcotic pain medicine as a cough suppressant - it works similar to codeine  cough syrup to help decrease cough.  If this doesn't help or the cough is excessive, please call the office. - You can return to normal activity 24-48 hours after surgery; avoid intense exercise for the first week. - Call the office if you experience any of the following: Fever higher than 101 F Complete loss of voice Difficulty breathing or swallowing Bleeding from the mouth  Voice Use After Surgery:  - Follow the voice rest instructions. Rest your voice for 4. In the ideal world, we would want you to rest your voice for 6 weeks. -  Please stop smoking   Nutrition: - Some patients have less of an appetite after surgery, and it is ok to decrease food intake. However, it is not ok to decrease fluid intake. It is very important to continue to drink plenty of fluids. - Drinking fluids will help lessen throat discomfort. -You can eat and drink as you normally do, but limit any foods that might cause acid reflux (fried foods, meat, caffeine or alcohol, acidic sauces or fruits).  You may want to eat  light meals the day of anesthesia to make sure you don't get nauseated.  Safety Information for Giving and Taking Medications: - Each time you give a medication, read the label. - If your medication is in liquid form, do not measure liquid with a kitchen spoon. There are pediatric measuring devices available at the pharmacy. Ask for one when you get your prescription filled. - If you have questions, ask the pharmacist. - Do not take Tylenol for pain if you have a history of liver problems. Check with your primary care doctor if you are uncertain. - Do not take ibuprofen  for pain if you have a history of stomach bleeding or ulcers and have been told to avoid Non-Steroidal Anti-Inflammatory Drugs (NSAIDs).  Certain blood thinners can also interact with ibuprofen .  Check with your primary care doctor if you are uncertain.  Follow-up Appointment: You should see Dr Anice approximately 2-3 weeks after your surgery. If you don't have an appointment, please call her office to schedule it.   You can resume all of your medications after this procedure

## 2024-08-15 ENCOUNTER — Encounter (HOSPITAL_COMMUNITY): Payer: Self-pay

## 2024-08-15 LAB — SURGICAL PATHOLOGY

## 2024-08-28 ENCOUNTER — Ambulatory Visit (INDEPENDENT_AMBULATORY_CARE_PROVIDER_SITE_OTHER)

## 2024-09-05 ENCOUNTER — Ambulatory Visit (INDEPENDENT_AMBULATORY_CARE_PROVIDER_SITE_OTHER): Admitting: Otolaryngology

## 2024-09-05 ENCOUNTER — Encounter: Payer: Self-pay | Admitting: Family Medicine

## 2024-09-05 ENCOUNTER — Ambulatory Visit (INDEPENDENT_AMBULATORY_CARE_PROVIDER_SITE_OTHER): Admitting: Family Medicine

## 2024-09-05 VITALS — BP 143/84 | HR 70 | Temp 98.1°F | Resp 16 | Ht 61.0 in | Wt 201.2 lb

## 2024-09-05 DIAGNOSIS — Z87898 Personal history of other specified conditions: Secondary | ICD-10-CM

## 2024-09-05 DIAGNOSIS — J4530 Mild persistent asthma, uncomplicated: Secondary | ICD-10-CM | POA: Diagnosis not present

## 2024-09-05 DIAGNOSIS — Z0289 Encounter for other administrative examinations: Secondary | ICD-10-CM | POA: Diagnosis not present

## 2024-09-09 ENCOUNTER — Encounter (INDEPENDENT_AMBULATORY_CARE_PROVIDER_SITE_OTHER): Payer: Self-pay

## 2024-09-09 ENCOUNTER — Ambulatory Visit (INDEPENDENT_AMBULATORY_CARE_PROVIDER_SITE_OTHER)

## 2024-09-09 VITALS — BP 151/90 | HR 98 | Temp 98.2°F

## 2024-09-09 DIAGNOSIS — J310 Chronic rhinitis: Secondary | ICD-10-CM | POA: Diagnosis not present

## 2024-09-09 DIAGNOSIS — J381 Polyp of vocal cord and larynx: Secondary | ICD-10-CM | POA: Diagnosis not present

## 2024-09-09 DIAGNOSIS — Z720 Tobacco use: Secondary | ICD-10-CM | POA: Diagnosis not present

## 2024-09-09 DIAGNOSIS — R490 Dysphonia: Secondary | ICD-10-CM | POA: Diagnosis not present

## 2024-09-09 DIAGNOSIS — R0981 Nasal congestion: Secondary | ICD-10-CM

## 2024-09-09 MED ORDER — NICOTINE 7 MG/24HR TD PT24: MEDICATED_PATCH | TRANSDERMAL | 2 refills | Status: AC

## 2024-09-09 NOTE — Progress Notes (Signed)
 Discussed the use of AI scribe software for clinical note transcription with the patient, who gave verbal consent to proceed.  History of Present Illness Kelly Meyers is a 48 year old female who presents with voice fatigue and nasal congestion following recent surgery.  Voice fatigue and dysphonia - Voice fatigue and decreased vocal capacity since surgery approximately one month ago - Voice becomes fatigued after a few hours of talking - Voice initially sounds good but becomes monotone and lower after extended use - Daily headaches and feeling drained after prolonged speaking - Concern regarding ability to return to work next week, as job requires extensive talking - No prior evaluation by speech therapy; appointment scheduled for late December - Currently on short-term disability due to symptoms  Nasal congestion and drainage - Nasal congestion and drainage began a few days ago - Inconsistent use of nasal sprays - No significant issues with swallowing  Tobacco use and smoking cessation - History of smoking with significant reduction since surgery - Use of nicotine  patches to aid cessation - Smoked only on two stressful days since surgery - No regular purchase of cigarettes since surgery  Physical Exam HEENT: Atraumatic, normocephalic. Nose with nasal congestion, drainage, and thick mucus present.   Procedure Note Pre-procedure diagnosis:  Dysphonia  Post-procedure diagnosis: Same Procedure: Transnasal Fiberoptic Laryngoscopy, CPT 31575 - Mod 25 Indication: residual hoarsenss after surgery Complications: None apparent EBL: 0 mL  The procedure was undertaken to further evaluate the patient's complaint of hoarseness, with mirror exam inadequate for appropriate examination due to gag reflex and poor patient tolerance  Procedure:  Patient was identified as correct patient. Verbal consent was obtained. The nose was sprayed with oxymetazoline  and 4% lidocaine . The The  flexible laryngoscope was passed through the nose to view the nasal cavity, pharynx (oropharynx, hypopharynx) and larynx.  The larynx was examined at rest and during multiple phonatory tasks. Documentation was obtained and reviewed with patient. The scope was removed. The patient tolerated the procedure well.  Findings: The nasal cavity and nasopharynx did not reveal any masses or lesions, mucosa appeared to be without obvious lesions. The tongue base, pharyngeal walls, piriform sinuses, vallecula, epiglottis and postcricoid region are normal in appearance EXCEPT: healing right TVF, excessive supraglottic recruitment. The visualized portion of the subglottis and proximal trachea is widely patent. The vocal folds are mobile bilaterally. There are no lesions on the free edge of the vocal folds nor elsewhere in the larynx worrisome for malignancy.            Electronically signed by: Penne Croak, DO 09/09/2024 12:09 PM  Assessment & Plan Status post excision of right vocal cord polyp Post-surgical healing progressing well. Residual strain and muscle pinching likely due to voice use. Voice therapy pending. - Continue voice rest as needed. - Proceed with scheduled voice therapy. - Extended work leave for four weeks for therapy and recovery.  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 Anticipated improvement with voice therapy. - Proceed with voice therapy as scheduled. - Extended work leave to accommodate therapy sessions.  Chronic rhinitis Nasal congestion and drainage present, contributing to voice strain. Significant mucus accumulation noted. - Use Flonase  nasal spray daily. - Increase saline nasal spray if epistaxis occurs.  Tobacco use Significant smoking reduction since surgery. Nicotine  patches effective for cravings. - Continue nicotine  patches. - Refilled nicotine  patches prescription. - Encouraged continued smoking  cessation efforts.   Follow up in 2-3 months

## 2024-09-10 ENCOUNTER — Encounter: Payer: Self-pay | Admitting: Family Medicine

## 2024-09-10 NOTE — Progress Notes (Signed)
 Established Patient Office Visit  Subjective    Patient ID: Kelly Meyers, female    DOB: [birth year]  Age: 48 y.o. MRN: [id number]  CC:  Chief Complaint  Patient presents with   Follow-up    Surgery follow, fmla paperwork    HPI Kelly Meyers presents for follow up of recent surgery for voice hoarseness. She reports that she is improving daily. She also requests FMLA paperwork to be completed for her asthma. She denies acute complaints.   Outpatient Encounter Medications as of 09/05/2024  Medication Sig   albuterol  (PROVENTIL ) (2.5 MG/3ML) 0.083% nebulizer solution USE 1 VIAL VIA NEBULIZER EVERY 6 HOURS AS NEEDED FOR WHEEZING OR SHORTNESS OF BREATH   albuterol  (VENTOLIN  HFA) 108 (90 Base) MCG/ACT inhaler INHALE 1 TO 2 PUFFS INTO THE LUNGS EVERY 6 HOURS AS NEEDED FOR WHEEZING OR SHORTNESS OF BREATH   amLODipine  (NORVASC ) 10 MG tablet Take 1 tablet (10 mg total) by mouth daily.   amoxicillin -clavulanate (AUGMENTIN ) 400-57 MG/5ML suspension Take 10 mLs (800 mg total) by mouth 2 (two) times daily.   Colloidal Oatmeal (ECZEMA MOISTURIZING EX) Apply 1 Application topically daily. Eczema Honey   fluticasone  (FLONASE ) 50 MCG/ACT nasal spray Place 2 sprays into both nostrils daily.   fluticasone  (FLOVENT  HFA) 220 MCG/ACT inhaler Inhale 2 puffs into the lungs daily.   Fluticasone  Furoate (ARNUITY ELLIPTA ) 100 MCG/ACT AEPB Inhale 2 puffs into the lungs daily.   fluticasone -salmeterol (ADVAIR DISKUS) 500-50 MCG/ACT AEPB Inhale 1 puff into the lungs in the morning and at bedtime.   hydrochlorothiazide  (HYDRODIURIL ) 25 MG tablet Take 1 tablet (25 mg total) by mouth daily.   levocetirizine (XYZAL  ALLERGY 24HR) 5 MG tablet Take 1 tablet (5 mg total) by mouth every evening.   nicotine  polacrilex (COMMIT) 4 MG lozenge RX #3 Weeks 10-12: 1 lozenge every 4-8 hours.Max 20 lozenges per day.   nicotine  polacrilex (NICORETTE ) 2 MG gum RX #3 Weeks 10-12: 1 piece every 4-8 hours.Max 24 pieces per  day.   omeprazole  (PRILOSEC) 20 MG capsule Take 2 capsules (40 mg total) by mouth daily.   sodium chloride  (OCEAN) 0.65 % SOLN nasal spray Place 1 spray into both nostrils in the morning, at noon, and at bedtime.   valsartan  (DIOVAN ) 320 MG tablet Take 1 tablet (320 mg total) by mouth daily.   [DISCONTINUED] nicotine  (NICODERM CQ  - DOSED IN MG/24 HR) 7 mg/24hr patch RX #2 Weeks 7-8: 7 mg x 1 patch dailyWear for 24 hours. If you have sleep disturbances, remove at bedtime..   famotidine  (PEPCID ) 20 MG tablet Take 1 tablet (20 mg total) by mouth 2 (two) times daily. (Patient not taking: Reported on 08/08/2024)   fluticasone  (FLONASE ) 50 MCG/ACT nasal spray Place 2 sprays into both nostrils daily. (Patient not taking: Reported on 09/05/2024)   meclizine  (ANTIVERT ) 25 MG tablet Take 1 tablet (25 mg total) by mouth 3 (three) times daily as needed for dizziness. (Patient not taking: Reported on 08/08/2024)   No facility-administered encounter medications on file as of 09/05/2024.    Past Medical History:  Diagnosis Date   Allergy    Anemia    Asthma    Complication of anesthesia    itching and rash after epidural   Hypertension     Past Surgical History:  Procedure Laterality Date   CESAREAN SECTION     MICROLARYNGOSCOPY WITH LASER Right 08/14/2024   Procedure: MICROLARYNGOSCOPY, WITH PROCEDURE USING CO2 LASER, STEROID INJECTION;  Surgeon: Anice Riis, DO;  Location: [facility]  OR;  Service: ENT;  Laterality: Right;  DML bronchoscopy and CO2 laser excision of the right vocal fold lesion    Family History  Problem Relation Age of Onset   Diabetes Mother    Multiple sclerosis Mother    Hypertension Father    Diabetes Father     Social History   Socioeconomic History   Marital status: Single    Spouse name: Not on file   Number of children: Not on file   Years of education: Not on file   Highest education level: Not on file  Occupational History   Not on file  Tobacco Use   Smoking  status: Every Day    Types: Cigarettes, Cigars   Smokeless tobacco: Never  Vaping Use   Vaping status: Never Used  Substance and Sexual Activity   Alcohol use: Yes    Comment: occ   Drug use: No    Comment: hx marijuana, last 2 mo ago 06/2024   Sexual activity: Yes    Birth control/protection: I.U.D.  Other Topics Concern   Not on file  Social History Narrative   Not on file   Social Drivers of Health   Financial Resource Strain: Low Risk  (11/02/2023)   Overall Financial Resource Strain (CARDIA)    Difficulty of Paying Living Expenses: Not hard at all  Food Insecurity: No Food Insecurity (08/08/2023)   Hunger Vital Sign    Worried About Running Out of Food in the Last Year: Never true    Ran Out of Food in the Last Year: Never true  Transportation Needs: No Transportation Needs (08/08/2023)   PRAPARE - Administrator, Civil Service (Medical): No    Lack of Transportation (Non-Medical): No  Physical Activity: Insufficiently Active (11/02/2023)   Exercise Vital Sign    Days of Exercise per Week: 4 days    Minutes of Exercise per Session: 30 min  Stress: No Stress Concern Present (11/02/2023)   [employer] of Occupational Health - Occupational Stress Questionnaire    Feeling of Stress : Only a little  Social Connections: Moderately Integrated (08/08/2023)   Social Connection and Isolation Panel    Frequency of Communication with Friends and Family: Twice a week    Frequency of Social Gatherings with Friends and Family: Once a week    Attends Religious Services: More than 4 times per year    Active Member of [employer] or Organizations: No    Attends Banker Meetings: Never    Marital Status: Living with partner  Intimate Partner Violence: Not At Risk (08/08/2023)   Humiliation, Afraid, Rape, and Kick questionnaire    Fear of Current or Ex-Partner: No    Emotionally Abused: No    Physically Abused: No    Sexually Abused: No    Review of Systems  All  other systems reviewed and are negative.       Objective    BP (!) 143/84   Pulse 70   Temp 98.1 F (36.7 C) (Oral)   Resp 16   Ht 5' 1 (1.549 m)   Wt 201 lb 3.2 oz (91.3 kg)   LMP 09/02/2024 (Exact Date)   SpO2 99%   BMI 38.02 kg/m   Physical Exam Vitals and nursing note reviewed.  Constitutional:      General: She is not in acute distress. HENT:     Mouth/Throat:     Mouth: Mucous membranes are moist.     Pharynx: Oropharynx is clear.  Cardiovascular:     Rate and Rhythm: Normal rate and regular rhythm.  Pulmonary:     Effort: Pulmonary effort is normal.     Breath sounds: Normal breath sounds.  Abdominal:     Palpations: Abdomen is soft.     Tenderness: There is no abdominal tenderness.  Neurological:     General: No focal deficit present.     Mental Status: She is alert and oriented to person, place, and time.         Assessment & Plan:   1. Mild persistent asthma without complication (Primary) Appears stable.   2. Encounter for completion of form with patient FMLA form completed  3. History of hoarseness Management as per consultant    No follow-ups on file.   Tanda Raguel SQUIBB, MD

## 2024-09-16 ENCOUNTER — Telehealth (INDEPENDENT_AMBULATORY_CARE_PROVIDER_SITE_OTHER): Payer: Self-pay

## 2024-09-16 NOTE — Telephone Encounter (Signed)
 09/16/2024 - Copy of updated completed form moved into patient record

## 2024-09-16 NOTE — Telephone Encounter (Signed)
 09/12/2024 - Patient brought in new forms to be completed by Dr. Anice since the dates have changed.  Leave is being switched to Short Term Disability. 09/12/2024 - Gave forms to Montie BROCKS, [place] for Dr. Anice to complete 09/13/2024 - Received completed forms from Dr. Anice 09/16/2024 - Faxed updated forms to [employer] Disability [employer] (Fax # [phone]) and received Fax Confirmation 09/16/2024 - Called and spoke with patient.  Let her know that I have faxed the completed updated forms and received Fax Confirmation

## 2024-09-20 ENCOUNTER — Telehealth (INDEPENDENT_AMBULATORY_CARE_PROVIDER_SITE_OTHER): Payer: Self-pay

## 2024-09-20 NOTE — Telephone Encounter (Signed)
 I spoke with patient this morning when she called into office eat 8:35am. She stated that her work place has not received her FMLA papers. I did see noted by Randine that she faxed them 09/16/24. Something must had went wrong during fax. The forms have not been scanned into patient chart yet from [employer]. Randine is out until Monday. I am send Randine a message so she will see it first thing Monday morning.I called patient back and informed her.

## 2024-09-25 ENCOUNTER — Ambulatory Visit: Admitting: Speech Pathology

## 2024-09-25 ENCOUNTER — Encounter: Payer: Self-pay | Admitting: Speech Pathology

## 2024-09-25 ENCOUNTER — Other Ambulatory Visit: Payer: Self-pay

## 2024-09-25 DIAGNOSIS — J381 Polyp of vocal cord and larynx: Secondary | ICD-10-CM | POA: Insufficient documentation

## 2024-09-25 DIAGNOSIS — R498 Other voice and resonance disorders: Secondary | ICD-10-CM | POA: Diagnosis not present

## 2024-09-25 DIAGNOSIS — Z720 Tobacco use: Secondary | ICD-10-CM | POA: Insufficient documentation

## 2024-09-25 DIAGNOSIS — R490 Dysphonia: Secondary | ICD-10-CM | POA: Diagnosis not present

## 2024-09-25 NOTE — Therapy (Signed)
 " OUTPATIENT SPEECH LANGUAGE PATHOLOGY VOICE EVALUATION   Patient Name: Kelly Meyers MRN: [id number] DOB:04/22/1976, 48 y.o., female Today's Date: 09/25/2024  PCP: Tanda Bleacher, MD REFERRING PROVIDER: Anice Riis, DO  END OF SESSION:  End of Session - 09/25/24 1209     Visit Number 1    Number of Visits 12    Date for Recertification  12/04/24    SLP Start Time 1015    SLP Stop Time  1100    SLP Time Calculation (min) 45 min    Activity Tolerance Patient tolerated treatment well          Past Medical History:  Diagnosis Date   Allergy    Anemia    Asthma    Complication of anesthesia    itching and rash after epidural   Hypertension    Past Surgical History:  Procedure Laterality Date   CESAREAN SECTION     MICROLARYNGOSCOPY WITH LASER Right 08/14/2024   Procedure: MICROLARYNGOSCOPY, WITH PROCEDURE USING CO2 LASER, STEROID INJECTION;  Surgeon: Anice Riis, DO;  Location: [facility] OR;  Service: ENT;  Laterality: Right;  DML bronchoscopy and CO2 laser excision of the right vocal fold lesion   Patient Active Problem List   Diagnosis Date Noted   Dysphonia 08/14/2024   Lesion of vocal fold 08/14/2024   Productive cough 10/28/2020   Mild persistent asthma without complication 10/28/2020   Recurrent boils 08/09/2018   ALLERGIC RHINITIS 12/26/2007   Asthma 12/26/2007    Onset date: 08/08/2024 (referral date)  REFERRING DIAG: R49.0 (ICD-10-CM) - Hoarseness of voice J38.1 (ICD-10-CM) - Reinke's edema of vocal folds J38.1 (ICD-10-CM) - Vocal cord polyp R49.0 (ICD-10-CM) - Dysphonia Z72.0 (ICD-10-CM) - Tobacco abuse  THERAPY DIAG:  Other voice and resonance disorders  Rationale for Evaluation and Treatment: Rehabilitation  SUBJECTIVE:   SUBJECTIVE STATEMENT: I get tired after talking for a while Pt accompanied by: self  PERTINENT HISTORY: Status post excision of right vocal cord polyp Post-surgical healing progressing well. Residual strain and  muscle pinching likely due to voice use. Voice therapy pending. - Continue voice rest as needed. - Proceed with scheduled voice therapy.  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 Anticipated improvement with voice therapy. - Proceed with voice therapy as scheduled.  Voice fatigue and dysphonia - Voice fatigue and decreased vocal capacity since surgery approximately one month ago - Voice becomes fatigued after a few hours of talking - Voice initially sounds good but becomes monotone and lower after extended use - Daily headaches and feeling drained after prolonged speaking - Concern regarding ability to return to work next week, as job requires extensive talking - Currently on short-term disability due to symptoms  PAIN:  Are you having pain? No  FALLS: Has patient fallen in last 6 months? No, Number of falls: 0  LIVING ENVIRONMENT: Lives with: lives with their family Lives in: House/apartment  PLOF:Level of assistance: Independent with ADLs, Independent with IADLs Employment: Full-time employment  PATIENT GOALS: I definitely want to gain that strength back in my voice and get it clear  OBJECTIVE:  Note: Objective measures were completed at Evaluation unless otherwise noted.  DIAGNOSTIC FINDINGS: Laryngoscopy Findings: The nasal cavity and nasopharynx did not reveal any masses or lesions, mucosa appeared to be without obvious lesions. The tongue base, pharyngeal walls, piriform sinuses, vallecula, epiglottis and postcricoid region are normal in appearance EXCEPT: healing right TVF, excessive supraglottic recruitment. The visualized portion of the subglottis and  proximal trachea is widely patent. The vocal folds are mobile bilaterally.  COGNITION: Overall cognitive status: Within functional limits for tasks assessed  Functional deficits:   SOCIAL HISTORY: Occupation: Tax Inspector intake:  optimal Caffeine/alcohol intake: minimal Daily voice use: excessive Tobacco: significantly reduced - working on quitting smoking  PERCEPTUAL VOICE ASSESSMENT: Voice quality: hoarse, strained, vocal fatigue, and pitch breaks Vocal abuse: excessive voice use and was throat clearing prior to surgery Resonance: normal Respiratory function: thoracic breathing  OBJECTIVE VOICE ASSESSMENT: Maximum phonation time for sustained ah: 6.45 seconds Conversational pitch average: 174.6 Hz (low normal) Conversational pitch range: 110 to 196 Hz Conversational loudness average: 70 dB Conversational loudness range: 76-69 dB S/z ratio: 1.04 (Suggestive of dysfunction >1.4)  PATIENT REPORTED OUTCOME MEASURES (PROM): V-RQOL: 36. [name] scored a 5, or as bad as it can be being frustrated due to her voice. She rated a 4, or a lot of a problem speaking loudly, running out of air when speaking, getting depressed due to voice, trouble using the phone due to voice, trouble doing her job due to voice and becoming less outgoing due to her voice                                                                                                                            TREATMENT DATE:   09/25/24: Eval completed. Initiated training in Semi-occluded vocal tract exercises (SOVTE) - after initial modeling of each exercise (hum, glide, accents, anthem) [name] required rare min verbal cues and modeling to completed each exercise accurately 10/10 reps. Reviewed recommendations for alginate and initiated training in reflux precautions. Education provided re: muscle tension dysphonia and to avoid whispering when her voice is tired or strained but to use confidential voice instead.    PATIENT EDUCATION: Education details: HEP for voice, vocal hygiene, reflux precuations Person educated: Patient Education method: Programmer, Multimedia, Demonstration, Verbal cues, and Handouts Education comprehension: returned demonstration, verbal  cues required, and needs further education  HOME EXERCISE PROGRAM: SOVTE, resonant voice, flow phonation, vocal function   GOALS: Goals reviewed with patient? Yes  SHORT TERM GOALS: Target date: 10/14/24  Pt will complete HEP for voice with mod I Baseline: Goal status: INITIAL  2.  Pt will follow 3 vocal hygiene strategies for return to work with mod I Baseline:  Goal status: INITIAL  3.  Pt will achieve clear phonation 18/20 sentences with rare min A Baseline:  Goal status: INITIAL  4.  Pt will maintain clear phonation over 10 minute conversation Baseline:  Goal status: INITIAL   LONG TERM GOALS: Target date: 12/04/24  Pt will report decreased vocal fatigue by 25% subjectively over 1 week Baseline:  Goal status: INITIAL  2.  Pt will maintain clear phonation at work Baseline:  Goal status: INITIAL  3.  Pt will improve score in VRQOL  Baseline: 36 Goal status: INITIAL  4.  Pt will follow 3 reflux precautions Baseline:  Goal status: INITIAL  ASSESSMENT:  CLINICAL IMPRESSION: Patient is a 48 y.o. female  who was seen today for moderate voice disorder s/p removal of polyp on vocal fold. Laryngoscopy revealed muscle tension dysphonia as well as Reinke's edema. Today she presents with mild hoarseness however she reports as the day progresses dysphonia increases and she experiences vocal fatigue and headaches. 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 She is taking pepcid , but was not aware of new rx for prilosec. Glottal fry noted frequently due to speaking on residual volume. Pt does report that family and friends often tell her that she speaks too low and they have trouble hearing her.  She denies globus sensation and any difficulty swallowing. Trials of resonant voice and slightly increased vocal intensity did improve voice quality indicating she is a good candidate for speech therapy. I recommend skilled ST to maximize voice  quality, intelligibility and train in vocal hygiene to return to work.   OBJECTIVE IMPAIRMENTS: include voice disorder. These impairments are limiting patient from effectively communicating at home and in community. Factors affecting potential to achieve goals and functional outcome are healing from surgery.. Patient will benefit from skilled SLP services to address above impairments and improve overall function.  REHAB POTENTIAL: Good  PLAN:  SLP FREQUENCY: 1-2x/week  SLP DURATION: 10 weeks  PLANNED INTERVENTIONS: Diet toleration management , Cueing hierachy, Internal/external aids, Functional tasks, Multimodal communication approach, SLP instruction and feedback, Compensatory strategies, Patient/family education, [phone] Treatment of speech (30 or 45 min) , and [id number]- Speech Eval Behavioral Qualitative Voice Resonance    Mathis Leita Caldron, CCC-SLP 09/25/2024, 12:30 PM      "

## 2024-09-25 NOTE — Patient Instructions (Signed)
" ° ° °  Semi-occluded vocal tract exercises (SOVTE)  These allow your vocal folds to vibrate without excess tension and promotes high placement of the voice  Use SOVTE as a warm up before prolonged speaking and vocal exercises  3x a day in air then in water  Hum in straw for 10 seconds - without strain  Pitch Glides for 2 minutes  Accents (siren)  Hum the [employer]  Be aware of not talking on residual air which sound like a fried voice - usually happens at the end of an utterance when you need to take a breath but don't  Try not to whisper- use your Neville breathy voice when when you are tired  Reflux Gourmet is an alginate which physically blocks reflux from coming up into the food pipe and throat - take 1 tsp after each meal and before bed     As always, use good belly breathing while completing SOVTE  "

## 2024-09-30 NOTE — Telephone Encounter (Signed)
 09/18/2024 - Fax Received - Request for Medical Information 09/30/2024 - Form printed and given to Cynthia C., Dr. Willeen CMA, for Dr. Anice to complete

## 2024-10-01 ENCOUNTER — Telehealth (INDEPENDENT_AMBULATORY_CARE_PROVIDER_SITE_OTHER): Payer: Self-pay

## 2024-10-01 NOTE — Telephone Encounter (Signed)
 The patient is checking in on FMLA forms that Dr Anice needed to sign and Randine was working on it yesterday, please advise if you can locate these forms and I can re-fax them.  Thanks!

## 2024-10-02 ENCOUNTER — Ambulatory Visit: Admitting: Speech Pathology

## 2024-10-04 NOTE — Telephone Encounter (Signed)
 10/04/2024 - Completed copy of Request for Medical Information Form moved into patient record 10/04/2024 - Called and spoke with patient to let her know that form was faxed to the number she requested and fax confirmation received

## 2024-10-04 NOTE — Telephone Encounter (Signed)
 10/04/2024 - Received completed Request for Medical Information Form from Dr. Anice 10/04/2024 - Faxed document to Fax # [phone] per Patient's request.  This # was given to the patient to pass along to us  so that the form would be processed faster. Received fax confirmation.

## 2024-10-07 ENCOUNTER — Ambulatory Visit: Attending: Speech Pathology | Admitting: Speech Pathology

## 2024-10-07 DIAGNOSIS — R498 Other voice and resonance disorders: Secondary | ICD-10-CM | POA: Insufficient documentation

## 2024-10-14 ENCOUNTER — Encounter: Payer: Self-pay | Admitting: Speech Pathology

## 2024-10-14 ENCOUNTER — Ambulatory Visit: Admitting: Speech Pathology

## 2024-10-14 DIAGNOSIS — R498 Other voice and resonance disorders: Secondary | ICD-10-CM

## 2024-10-14 NOTE — Patient Instructions (Signed)
" ° °  You have tension in your larynx limiting the air flow when you speak, resulting in a hoarse or gravelly voice. We call this pressed speech   We use flow phonation to improve proper air flow during speech. Sounds  that promote air flow include: s, z, sh, th, f, v, ch. We call this flow speech  After you complete the semi-occluded vocal tract exercises (straw exercises & gargling), complete the following twice a day  Use a tissue to focus on airflow:  Whooo 10x Shoe 10x Sue 10x  Shoe-Fu Sue-Pu He- She Fu-Sue Pu-Lu   Who are you?  Who is Beverley?  She sells sea shells  See Sue's shoes  Fifty-Fifty  Hit the hammer  High school hero  Hocus Pocus  To each his own  Zebra's zig zag at the zoo  [employer] chews cheddar cheese  Choosy moms choose [employer] Freddy prefers french fries  Vince vowed to vote  Feel the furry fish  She should polish her shoes  Show them the fresh fruit  Teachers eat ripe peaches at [employer]  Not now nor never  My mama makes me muffins  No one knows Norman's nickname  See Ginnie Sleep soundly by the sea     "

## 2024-10-14 NOTE — Therapy (Signed)
 " OUTPATIENT SPEECH LANGUAGE PATHOLOGY VOICE TREATMENT   Patient Name: Kelly Meyers MRN: [id number] DOB:1975/12/23, 49 y.o., female Today's Date: 10/14/2024  PCP: Tanda Bleacher, MD REFERRING PROVIDER: Anice Riis, DO  END OF SESSION:  End of Session - 10/14/24 0943     Visit Number 2    Number of Visits 12    Date for Recertification  12/04/24    SLP Start Time 0943   arrived late   SLP Stop Time  1015    SLP Time Calculation (min) 32 min    Activity Tolerance Patient tolerated treatment well          Past Medical History:  Diagnosis Date   Allergy    Anemia    Asthma    Complication of anesthesia    itching and rash after epidural   Hypertension    Past Surgical History:  Procedure Laterality Date   CESAREAN SECTION     MICROLARYNGOSCOPY WITH LASER Right 08/14/2024   Procedure: MICROLARYNGOSCOPY, WITH PROCEDURE USING CO2 LASER, STEROID INJECTION;  Surgeon: Anice Riis, DO;  Location: [facility] OR;  Service: ENT;  Laterality: Right;  DML bronchoscopy and CO2 laser excision of the right vocal fold lesion   Patient Active Problem List   Diagnosis Date Noted   Dysphonia 08/14/2024   Lesion of vocal fold 08/14/2024   Productive cough 10/28/2020   Mild persistent asthma without complication 10/28/2020   Recurrent boils 08/09/2018   ALLERGIC RHINITIS 12/26/2007   Asthma 12/26/2007    Onset date: 08/08/2024 (referral date)  REFERRING DIAG: R49.0 (ICD-10-CM) - Hoarseness of voice J38.1 (ICD-10-CM) - Reinke's edema of vocal folds J38.1 (ICD-10-CM) - Vocal cord polyp R49.0 (ICD-10-CM) - Dysphonia Z72.0 (ICD-10-CM) - Tobacco abuse  THERAPY DIAG:  Other voice and resonance disorders  Rationale for Evaluation and Treatment: Rehabilitation  SUBJECTIVE:   SUBJECTIVE STATEMENT: I get tired after talking for a while Pt accompanied by: self  PERTINENT HISTORY: Status post excision of right vocal cord polyp Post-surgical healing progressing well. Residual  strain and muscle pinching likely due to voice use. Voice therapy pending. - Continue voice rest as needed. - Proceed with scheduled voice therapy.  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 Anticipated improvement with voice therapy. - Proceed with voice therapy as scheduled.  Voice fatigue and dysphonia - Voice fatigue and decreased vocal capacity since surgery approximately one month ago - Voice becomes fatigued after a few hours of talking - Voice initially sounds good but becomes monotone and lower after extended use - Daily headaches and feeling drained after prolonged speaking - Concern regarding ability to return to work next week, as job requires extensive talking - Currently on short-term disability due to symptoms  PAIN:  Are you having pain? No  FALLS: Has patient fallen in last 6 months? No, Number of falls: 0  LIVING ENVIRONMENT: Lives with: lives with their family Lives in: House/apartment  PLOF:Level of assistance: Independent with ADLs, Independent with IADLs Employment: Full-time employment  PATIENT GOALS: I definitely want to gain that strength back in my voice and get it clear  OBJECTIVE:  Note: Objective measures were completed at Evaluation unless otherwise noted.  DIAGNOSTIC FINDINGS: Laryngoscopy Findings: The nasal cavity and nasopharynx did not reveal any masses or lesions, mucosa appeared to be without obvious lesions. The tongue base, pharyngeal walls, piriform sinuses, vallecula, epiglottis and postcricoid region are normal in appearance EXCEPT: healing right TVF, excessive supraglottic recruitment. The visualized portion of  the subglottis and proximal trachea is widely patent. The vocal folds are mobile bilaterally.  COGNITION: Overall cognitive status: Within functional limits for tasks assessed  Functional deficits:   SOCIAL HISTORY: Occupation: Tax Inspector intake:  optimal Caffeine/alcohol intake: minimal Daily voice use: excessive Tobacco: significantly reduced - working on quitting smoking  PERCEPTUAL VOICE ASSESSMENT: Voice quality: hoarse, strained, vocal fatigue, and pitch breaks Vocal abuse: excessive voice use and was throat clearing prior to surgery Resonance: normal Respiratory function: thoracic breathing  OBJECTIVE VOICE ASSESSMENT: Maximum phonation time for sustained ah: 6.45 seconds Conversational pitch average: 174.6 Hz (low normal) Conversational pitch range: 110 to 196 Hz Conversational loudness average: 70 dB Conversational loudness range: 76-69 dB S/z ratio: 1.04 (Suggestive of dysfunction >1.4)  PATIENT REPORTED OUTCOME MEASURES (PROM): V-RQOL: 36. Silveria scored a 5, or as bad as it can be being frustrated due to her voice. She rated a 4, or a lot of a problem speaking loudly, running out of air when speaking, getting depressed due to voice, trouble using the phone due to voice, trouble doing her job due to voice and becoming less outgoing due to her voice                                                                                                                            TREATMENT DATE:   10/14/24: Nathaniel reports completing SOVTE regularly. She enters with low, hoarse voice. Targeted clear phonation with stretch flow using tissue for feedback, progressing to flow phrases and sentences. Masyn maintained clear phonation with flow and resonant sentences 15/20 - initially she required frerqeunt verbal cues to identify when she was using hoarse back focused phonation and to correct to forward focus phonation. As session progressed, Chaunda was able to ID and correct back, hoarse voice with occasional min A. She returns to work today. Targeted her phone greeting, highlighting flow and resonant sounds on printed greeting and placing visual cues for when she is to pause and breathe as greeting is several sentences. She  maintained clear phonation on greeting 3/3 trials with occasional min A. Instructed her to complete SOVTE (straw or trills) and stretch flow phonation with tissue on her breaks or whenever she senses vocal strain or fatigue. She is to rest her voice after work this week.  09/25/24: Eval completed. Initiated training in Semi-occluded vocal tract exercises (SOVTE) - after initial modeling of each exercise (hum, glide, accents, anthem) Sarely required rare min verbal cues and modeling to completed each exercise accurately 10/10 reps. Reviewed recommendations for alginate and initiated training in reflux precautions. Education provided re: muscle tension dysphonia and to avoid whispering when her voice is tired or strained but to use confidential voice instead.    PATIENT EDUCATION: Education details: HEP for voice, vocal hygiene, reflux precuations Person educated: Patient Education method: Programmer, Multimedia, Facilities Manager, Verbal cues, and Handouts Education comprehension: returned demonstration, verbal cues required, and needs further education  HOME EXERCISE PROGRAM: SOVTE, resonant voice,  flow phonation, vocal function   GOALS: Goals reviewed with patient? Yes  SHORT TERM GOALS: Target date: 10/14/24  Pt will complete HEP for voice with mod I Baseline: Goal status: INITIAL  2.  Pt will follow 3 vocal hygiene strategies for return to work with mod I Baseline:  Goal status: INITIAL  3.  Pt will achieve clear phonation 18/20 sentences with rare min A Baseline:  Goal status: INITIAL  4.  Pt will maintain clear phonation over 10 minute conversation Baseline:  Goal status: INITIAL   LONG TERM GOALS: Target date: 12/04/24  Pt will report decreased vocal fatigue by 25% subjectively over 1 week Baseline:  Goal status: INITIAL  2.  Pt will maintain clear phonation at work Baseline:  Goal status: INITIAL  3.  Pt will improve score in VRQOL  Baseline: 36 Goal status: INITIAL  4.  Pt  will follow 3 reflux precautions Baseline:  Goal status: INITIAL  ASSESSMENT:  CLINICAL IMPRESSION: Patient is a 49 y.o. female  who was seen today for moderate voice disorder s/p removal of polyp on vocal fold. Laryngoscopy revealed muscle tension dysphonia as well as Reinke's edema. Today she presents with mild hoarseness however she reports as the day progresses dysphonia increases and she experiences vocal fatigue and headaches. 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 She is taking pepcid , but was not aware of new rx for prilosec. Glottal fry noted frequently due to speaking on residual volume. Pt does report that family and friends often tell her that she speaks too low and they have trouble hearing her.  She denies globus sensation and any difficulty swallowing. Trials of resonant voice and slightly increased vocal intensity did improve voice quality indicating she is a good candidate for speech therapy. I recommend skilled ST to maximize voice quality, intelligibility and train in vocal hygiene to return to work.   OBJECTIVE IMPAIRMENTS: include voice disorder. These impairments are limiting patient from effectively communicating at home and in community. Factors affecting potential to achieve goals and functional outcome are healing from surgery.. Patient will benefit from skilled SLP services to address above impairments and improve overall function.  REHAB POTENTIAL: Good  PLAN:  SLP FREQUENCY: 1-2x/week  SLP DURATION: 10 weeks  PLANNED INTERVENTIONS: Diet toleration management , Cueing hierachy, Internal/external aids, Functional tasks, Multimodal communication approach, SLP instruction and feedback, Compensatory strategies, Patient/family education, [phone] Treatment of speech (30 or 45 min) , and [id number]- Speech Eval Behavioral Qualitative Voice Resonance    Mathis Leita Caldron, CCC-SLP 10/14/2024, 10:16 AM      "

## 2024-10-21 ENCOUNTER — Ambulatory Visit: Admitting: Speech Pathology

## 2024-10-23 ENCOUNTER — Ambulatory Visit: Admitting: Speech Pathology

## 2024-10-28 ENCOUNTER — Ambulatory Visit: Admitting: Speech Pathology

## 2024-10-30 ENCOUNTER — Encounter: Payer: Self-pay | Admitting: Speech Pathology

## 2024-10-30 ENCOUNTER — Ambulatory Visit: Admitting: Speech Pathology

## 2024-10-30 NOTE — Therapy (Signed)
 SPEECH THERAPY DISCHARGE SUMMARY  Visits from Start of Care: 2  Current functional level related to goals / functional outcomes: At last ST session, Avian was achieving WNL voice and volume. She was able to correct low hoarse voice with rare min verbal cues.    Remaining deficits: Dysphonia   Education / Equipment: HEP for voice, vocal hygiene    Patient goals were not met. Patient is being discharged due to not returning since the last visit. She has cancelled/no show 5x - front office reviewed cancel with in 24 hours/no show policy last week when she cancelled the morning of her appointment.   Itai Barbian MS, CCC-SLP

## 2024-11-04 ENCOUNTER — Ambulatory Visit: Admitting: Speech Pathology

## 2024-11-14 ENCOUNTER — Ambulatory Visit (INDEPENDENT_AMBULATORY_CARE_PROVIDER_SITE_OTHER)

## 2024-12-05 ENCOUNTER — Ambulatory Visit: Admitting: Family Medicine
